# Patient Record
Sex: Male | Born: 1960 | Race: White | Hispanic: No | Marital: Married | Smoking: Never smoker
Health system: Southern US, Community
[De-identification: ages and names within clinical notes are randomized; demographics above are authoritative.]

## PROBLEM LIST (undated history)

## (undated) DIAGNOSIS — K649 Unspecified hemorrhoids: Secondary | ICD-10-CM

## (undated) DIAGNOSIS — T8859XA Other complications of anesthesia, initial encounter: Secondary | ICD-10-CM

## (undated) DIAGNOSIS — I10 Essential (primary) hypertension: Secondary | ICD-10-CM

## (undated) DIAGNOSIS — E781 Pure hyperglyceridemia: Secondary | ICD-10-CM

## (undated) DIAGNOSIS — K219 Gastro-esophageal reflux disease without esophagitis: Secondary | ICD-10-CM

## (undated) DIAGNOSIS — F419 Anxiety disorder, unspecified: Secondary | ICD-10-CM

## (undated) DIAGNOSIS — Z87442 Personal history of urinary calculi: Secondary | ICD-10-CM

## (undated) DIAGNOSIS — K449 Diaphragmatic hernia without obstruction or gangrene: Secondary | ICD-10-CM

## (undated) DIAGNOSIS — T4145XA Adverse effect of unspecified anesthetic, initial encounter: Secondary | ICD-10-CM

## (undated) HISTORY — DX: Pure hyperglyceridemia: E78.1

## (undated) HISTORY — DX: Anxiety disorder, unspecified: F41.9

---

## 2001-11-29 ENCOUNTER — Encounter: Payer: Self-pay | Admitting: Family Medicine

## 2001-11-29 ENCOUNTER — Ambulatory Visit (HOSPITAL_COMMUNITY): Admission: RE | Admit: 2001-11-29 | Discharge: 2001-11-29 | Payer: Self-pay | Admitting: Family Medicine

## 2002-03-21 ENCOUNTER — Ambulatory Visit (HOSPITAL_COMMUNITY): Admission: RE | Admit: 2002-03-21 | Discharge: 2002-03-21 | Payer: Self-pay | Admitting: Gastroenterology

## 2002-03-21 ENCOUNTER — Encounter: Payer: Self-pay | Admitting: Gastroenterology

## 2010-07-02 ENCOUNTER — Encounter: Admission: RE | Admit: 2010-07-02 | Discharge: 2010-07-02 | Payer: Self-pay | Admitting: Family Medicine

## 2010-10-17 HISTORY — PX: POSTEROLATERAL EXTRAFORAMINAL DISCECTOMY: SHX2260

## 2011-01-05 ENCOUNTER — Encounter (HOSPITAL_COMMUNITY)
Admission: RE | Admit: 2011-01-05 | Discharge: 2011-01-05 | Disposition: A | Discharge status: Home / Self Care | Payer: BC Managed Care – PPO | Source: Ambulatory Visit | Attending: Neurosurgery | Admitting: Neurosurgery

## 2011-01-05 DIAGNOSIS — Z01812 Encounter for preprocedural laboratory examination: Secondary | ICD-10-CM | POA: Insufficient documentation

## 2011-01-05 LAB — CBC
HCT: 40.9 % (ref 39.0–52.0)
Hemoglobin: 14.4 g/dL (ref 13.0–17.0)
MCH: 30.4 pg (ref 26.0–34.0)
MCHC: 35.2 g/dL (ref 30.0–36.0)
MCV: 86.3 fL (ref 78.0–100.0)
Platelets: 160 10*3/uL (ref 150–400)
RBC: 4.74 MIL/uL (ref 4.22–5.81)
RDW: 12.2 % (ref 11.5–15.5)
WBC: 5 10*3/uL (ref 4.0–10.5)

## 2011-01-05 LAB — SURGICAL PCR SCREEN
MRSA, PCR: NEGATIVE
Staphylococcus aureus: NEGATIVE

## 2011-01-13 ENCOUNTER — Observation Stay (HOSPITAL_COMMUNITY): Payer: BC Managed Care – PPO

## 2011-01-13 ENCOUNTER — Inpatient Hospital Stay (HOSPITAL_COMMUNITY)
Admission: RE | Admit: 2011-01-13 | Discharge: 2011-01-16 | DRG: 558 | Disposition: A | Discharge status: Home / Self Care | Payer: BC Managed Care – PPO | Source: Ambulatory Visit | Attending: Neurosurgery | Admitting: Neurosurgery

## 2011-01-13 DIAGNOSIS — J385 Laryngeal spasm: Secondary | ICD-10-CM | POA: Diagnosis not present

## 2011-01-13 DIAGNOSIS — R0989 Other specified symptoms and signs involving the circulatory and respiratory systems: Secondary | ICD-10-CM | POA: Diagnosis not present

## 2011-01-13 DIAGNOSIS — M47812 Spondylosis without myelopathy or radiculopathy, cervical region: Secondary | ICD-10-CM | POA: Diagnosis present

## 2011-01-13 DIAGNOSIS — Z882 Allergy status to sulfonamides status: Secondary | ICD-10-CM

## 2011-01-13 DIAGNOSIS — K5900 Constipation, unspecified: Secondary | ICD-10-CM | POA: Diagnosis not present

## 2011-01-13 DIAGNOSIS — J811 Chronic pulmonary edema: Secondary | ICD-10-CM | POA: Diagnosis not present

## 2011-01-13 DIAGNOSIS — Z23 Encounter for immunization: Secondary | ICD-10-CM

## 2011-01-13 DIAGNOSIS — M5020 Other cervical disc displacement, unspecified cervical region: Principal | ICD-10-CM | POA: Diagnosis present

## 2011-01-13 DIAGNOSIS — K219 Gastro-esophageal reflux disease without esophagitis: Secondary | ICD-10-CM | POA: Diagnosis present

## 2011-01-13 DIAGNOSIS — R0609 Other forms of dyspnea: Secondary | ICD-10-CM | POA: Diagnosis not present

## 2011-01-13 DIAGNOSIS — J690 Pneumonitis due to inhalation of food and vomit: Secondary | ICD-10-CM | POA: Diagnosis not present

## 2011-01-13 LAB — CBC
HCT: 40.7 % (ref 39.0–52.0)
Hemoglobin: 14.5 g/dL (ref 13.0–17.0)
MCH: 30.7 pg (ref 26.0–34.0)
MCHC: 35.6 g/dL (ref 30.0–36.0)
MCV: 86.2 fL (ref 78.0–100.0)
Platelets: 168 10*3/uL (ref 150–400)
RBC: 4.72 MIL/uL (ref 4.22–5.81)
RDW: 12.2 % (ref 11.5–15.5)
WBC: 6.7 10*3/uL (ref 4.0–10.5)

## 2011-01-13 LAB — BLOOD GAS, ARTERIAL
Acid-base deficit: 0.9 mmol/L (ref 0.0–2.0)
Bicarbonate: 24.7 mEq/L — ABNORMAL HIGH (ref 20.0–24.0)
Drawn by: 287601
FIO2: 1 %
MECHVT: 700 mL
O2 Saturation: 97.3 %
PEEP: 5 cmH2O
Patient temperature: 98.6
RATE: 14 resp/min
TCO2: 26.3 mmol/L (ref 0–100)
pCO2 arterial: 51.9 mmHg — ABNORMAL HIGH (ref 35.0–45.0)
pH, Arterial: 7.298 — ABNORMAL LOW (ref 7.350–7.450)
pO2, Arterial: 105 mmHg — ABNORMAL HIGH (ref 80.0–100.0)

## 2011-01-14 ENCOUNTER — Inpatient Hospital Stay (HOSPITAL_COMMUNITY): Payer: BC Managed Care – PPO

## 2011-01-14 LAB — BASIC METABOLIC PANEL
BUN: 12 mg/dL (ref 6–23)
CO2: 28 mEq/L (ref 19–32)
Calcium: 8.9 mg/dL (ref 8.4–10.5)
Chloride: 100 mEq/L (ref 96–112)
Creatinine, Ser: 1.03 mg/dL (ref 0.4–1.5)
GFR calc Af Amer: >60 mL/min (ref >60)
GFR calc non Af Amer: >60 mL/min (ref >60)
Glucose, Bld: 150 mg/dL — ABNORMAL HIGH (ref 70–99)
Potassium: 4.6 mEq/L (ref 3.5–5.1)
Sodium: 136 mEq/L (ref 135–145)

## 2011-01-14 LAB — DIFFERENTIAL
Basophils Absolute: 0 10*3/uL (ref 0.0–0.1)
Basophils Relative: 0 % (ref 0–1)
Eosinophils Absolute: 0 10*3/uL (ref 0.0–0.7)
Eosinophils Relative: 0 % (ref 0–5)
Lymphocytes Relative: 5 % — ABNORMAL LOW (ref 12–46)
Lymphs Abs: 1.1 10*3/uL (ref 0.7–4.0)
Monocytes Absolute: 1.4 10*3/uL — ABNORMAL HIGH (ref 0.1–1.0)
Monocytes Relative: 7 % (ref 3–12)
Neutro Abs: 19.4 10*3/uL — ABNORMAL HIGH (ref 1.7–7.7)
Neutrophils Relative %: 89 % — ABNORMAL HIGH (ref 43–77)

## 2011-01-14 LAB — CBC
HCT: 42.6 % (ref 39.0–52.0)
Hemoglobin: 14.9 g/dL (ref 13.0–17.0)
MCH: 30.5 pg (ref 26.0–34.0)
MCHC: 35 g/dL (ref 30.0–36.0)
MCV: 87.3 fL (ref 78.0–100.0)
Platelets: 185 10*3/uL (ref 150–400)
RBC: 4.88 MIL/uL (ref 4.22–5.81)
RDW: 12.5 % (ref 11.5–15.5)
WBC: 21.9 10*3/uL — ABNORMAL HIGH (ref 4.0–10.5)

## 2011-01-14 NOTE — Op Note (Signed)
Carlos Richards, [name]              ACCOUNT NO.:  [id number]  MEDICAL RECORD NO.:  [id number]           PATIENT TYPE:  I  LOCATION:  3110                         FACILITY:  [facility]  PHYSICIAN:  Hewitt Shorts, M.D.DATE OF BIRTH:  Dec 03, 1960  DATE OF PROCEDURE:  01/13/2011 DATE OF DISCHARGE:                              OPERATIVE REPORT   PREOPERATIVE DIAGNOSES:  Cervical disk herniation, cervical spondylosis, cervical degenerative disk disease, and cervical radiculopathy.  POSTOPERATIVE DIAGNOSES:  Cervical disk herniation, cervical spondylosis, cervical degenerative disk disease, and cervical radiculopathy.  PROCEDURE:  C5-6 anterior cervical decompression, arthrodesis with allograft and Tether cervical plating.  SURGEON:  Hewitt Shorts, MD  ANESTHESIA:  General endotracheal.  INDICATIONS:  The patient is a 50 year old man who presented with left cervical radiculopathy.  Symptoms have varied over the past 6 or more months but have persistently been on the left side, at times radiating more down to the left upper extremity and at times, more around the left scapula.  A decision was made to proceed with single-level ACDF.  PROCEDURE:  The patient was brought to the operating room and placed under general endotracheal anesthesia.  The patient was placed in 10 pounds of Holter traction.  The neck was prepped with Betadine soap solution and draped in sterile fashion.  A horizontal incision was made in the left side of neck.  The line of the incision was infiltrated with local anesthetic with epinephrine.  Dissection of the subcutaneous tissue and platysma.  Bipolar cautery was used to maintain hemostasis. Dissection was then carried out through the an avascular plane leaving the sternocleidomastoid, carotid artery, and jugular vein laterally and trachea and esophagus medially.  The ventral aspect of the vertebral column was identified and localized x-ray was taken and the  C5-6 intervertebral disk space was identified.  Diskectomy was begun with incision of the annulus and continued with micro curettes and pituitary rongeurs.  Anterior osteophytic overgrowth was removed using the osteophyte removal tool, Kerrison punches, and the high-speed drill. The operating microscope was draped and brought into the field to provide additional magnification, illumination, and visualization.  The remainder of the decompression was performed using microdissection and microsurgical technique.  The cartilaginous endplates of the vertebral bodies were removed using micro curettes along with a high-speed drill and then posterior osteophytic overgrowth was removed using the high- speed drill along a 2-mm Kerrison punch.  The posterior ligament that was carefully removed using a 2-mm Kerrison punch and we were able to decompress the central portion of the spinal canal.  The decompression extending to the right was completed, decompressing the lateral recess and neural foramen and then we turned our attention to the left lateral recess and neural foramen where there was spondylotic disk herniation. This was carefully removed in a piecemeal fashion with 2-mm Kerrison punch extending laterally into the neural foramen, decompressing the exiting left C6 nerve root.  Once decompression was completed, hemostasis was established with use of Gelfoam soaked in thrombin.  We measured the height of the intervertebral disk space and selected a 7-mm allograft implant.  It was positioned in the intervertebral disk  space and countersunk.  We then selected a 12-mm Tether cervical plate.  It was positioned over the fusion construct and secured to the vertebra with 4 x 14 mm screws using a pair of 6 screws at C5 and a pair of variable screws at C6.  X-ray was taken which showed the graft, plate, and screws all in good position.  The alignment was good and then the wound was irrigated with  bacitracin solution, checked for hemostasis which was established and confirmed and then we proceeded with closure. The platysma was closed with interrupted inverted 2-0 undyed Vicryl sutures, subcutaneous and subcuticular were closed with interrupted inverted 3-0 undyed Vicryl sutures, and skin was reapproximated with Dermabond.  The procedure was tolerated well.  The estimated blood loss was 75 mL.  Sponge and needles counts were correct.  Following surgery, the patient was reversed from the anesthetic, extubated, and transferred to recovery room for further care.  However, shortly after arriving in the recovery room, he developed laryngospasm and oxygen desaturation. He was attended to immediately by the Anesthesia Service bagged with 100% oxygen, re-anesthetized and reintubated.  The patient was placed on ventilator.  The sedation of the anesthesia was allowed to diminish such that the patient could open his eyes and he was noted to be moving all 4 extremities to command, squeezing my hand strongly, wiggling his toes well, protruding his tongue on command, holding thumbs up on command. He was then re-sedated and briefly managed by the Anesthesia Service and extubated upon their assessment.  His incision in the recovery room was noted to be well closed.  No underlying swelling.  No evidence of hematoma.     Hewitt Shorts, M.D.     RWN/MEDQ  D:  01/13/2011  T:  01/14/2011  Job:  [id number]  Electronically Signed by Shirlean Kelly M.D. on 01/14/2011 09:02:45 AM

## 2011-01-15 ENCOUNTER — Inpatient Hospital Stay (HOSPITAL_COMMUNITY): Payer: BC Managed Care – PPO

## 2011-01-15 LAB — CBC
HCT: 40.3 % (ref 39.0–52.0)
Hemoglobin: 13.8 g/dL (ref 13.0–17.0)
MCH: 31.4 pg (ref 26.0–34.0)
MCHC: 34.2 g/dL (ref 30.0–36.0)
MCV: 91.6 fL (ref 78.0–100.0)
Platelets: 152 10*3/uL (ref 150–400)
RBC: 4.4 MIL/uL (ref 4.22–5.81)
RDW: 12.8 % (ref 11.5–15.5)
WBC: 12.5 10*3/uL — ABNORMAL HIGH (ref 4.0–10.5)

## 2011-01-15 LAB — BASIC METABOLIC PANEL
BUN: 15 mg/dL (ref 6–23)
CO2: 33 mEq/L — ABNORMAL HIGH (ref 19–32)
Calcium: 8.8 mg/dL (ref 8.4–10.5)
Chloride: 105 mEq/L (ref 96–112)
Creatinine, Ser: 1.1 mg/dL (ref 0.4–1.5)
GFR calc Af Amer: >60 mL/min (ref >60)
GFR calc non Af Amer: >60 mL/min (ref >60)
Glucose, Bld: 104 mg/dL — ABNORMAL HIGH (ref 70–99)
Potassium: 5 mEq/L (ref 3.5–5.1)
Sodium: 140 mEq/L (ref 135–145)

## 2011-01-16 ENCOUNTER — Inpatient Hospital Stay (HOSPITAL_COMMUNITY): Payer: BC Managed Care – PPO

## 2011-01-16 DIAGNOSIS — J954 Chemical pneumonitis due to anesthesia: Secondary | ICD-10-CM

## 2011-01-20 NOTE — Discharge Summary (Signed)
Carlos Richards, Carlos Richards              ACCOUNT NO.:  [id number]  MEDICAL RECORD NO.:  [id number]           PATIENT TYPE:  I  LOCATION:  3017                         FACILITY:  [facility]  PHYSICIAN:  Hewitt Shorts, M.D.DATE OF BIRTH:  Jan 13, 1961  DATE OF ADMISSION:  01/13/2011 DATE OF DISCHARGE:  01/16/2011                              DISCHARGE SUMMARY   ADMISSION HISTORY AND PHYSICAL EXAMINATION:  The patient is a 50 year old man who presented with left cervical radiculopathy over 6 months ago.  He had been treated with nonsurgical measures without relief and had persistent radicular symptoms.  General examination was unremarkable.  Neurologic examination showed intact strength and sensation.  HOSPITAL COURSE:  The patient was admitted, underwent a single level C5- 6 anterior cervical decompression, arthrodesis with allograft and Tether cervical plating.  He did well for the surgery; however, following emergence from the anesthesia and upon transfer to the recovery room, he suddenly developed hypoxia and laryngospasm.  He was immediately attended to by the nurse anesthetist and anesthesiologist.  He was ventilated with a mask and oxygen and was reanesthetized and reintubated by the Anesthesia Service.  Dr. Randa Evens, the anesthesiologist, monitored him over the next 3-4 hours, allowed him to emerge from the sedation and then he was re-extubated and did well.  However, he had initially a persistent cough.  Chest x-ray showed bilateral airspace disease.  The chest x-ray was repeated the following day while he was being monitored in the intensive care unit.  It showed patchy airspace disease bilaterally.  He had a white blood cell count of 21.9 (but he had been given Decadron by Dr. Randa Evens in the recovery room).  I therefore obtained a Pulmonary Medicine consultation from Dr. Billy Fischer.  He felt that the appearance was most likely negative pressure pulmonary edema, but he could  not rule out aspiration.  Chest x-rays were repeated over the past 2 days and showed progressive clearing of the airspace disease.  He however was started by Dr. Sung Amabile on Augmentin and continues on this.  Dr. Shelle Iron has seen him in followup yesterday and today.  He feels that he can be discharged to home but continued on Augmentin 875 mg b.i.d. for 8 days and a prescription has been written for that.  From the cervical spine perspective, he has been up and ambulating, his wound is healing nicely.  There is no swelling, erythema, or drainage. He is in his cervical collar.  He is walking in the halls.  However, he has had difficulties with constipation, and I discussed with him giving him a laxatives and monitoring him until tomorrow versus giving him a laxative and having him continued treatment with other laxatives if needed.  He would prefer the latter approach.  We are going to give him a dose of milk of magnesia but he understands that he can use Dulcolax tablets, Dulcolax suppositories, Fleet Phospho-Soda, or magnesium citrate as needed for further constipation.  We are giving him a prescription for Vicodin one or two tablets p.o. q.4- 6 h. p.r.n. pain.  He is to follow up with me in 3 weeks for  followup. If he needs to return sooner or has other difficulties, he is to contact us.  DISCHARGE DIAGNOSES: 1. Cervical disk herniations, cervical spondylosis, cervical     degenerative disease, cervical radiculopathy. 2. Bilateral airspace disease. 3. Negative-pressure pulmonary edema secondary to laryngospasm and     straining versus aspiration pneumonia, improving on antibiotics.     Also white blood cell count improved in followup.  Discharge prescription for Vicodin 1-2 tablets p.o. q.4-6 h. p.r.n. pain, 40 tablets, no refills; Augmentin 875 mg b.i.d. 16 tablets, no refills.     Hewitt Shorts, M.D.     RWN/MEDQ  D:  01/16/2011  T:  01/16/2011  Job:   [id number]  Electronically Signed by Shirlean Kelly M.D. on 01/20/2011 09:20:41 AM

## 2011-01-20 NOTE — H&P (Signed)
Carlos Richards, Carlos Richards              ACCOUNT NO.:  [id number]  MEDICAL RECORD NO.:  [id number]           PATIENT TYPE:  I  LOCATION:  3017                         FACILITY:  [facility]  PHYSICIAN:  Hewitt Shorts, M.D.DATE OF BIRTH:  [birth year]  DATE OF ADMISSION:  01/13/2011 DATE OF DISCHARGE:                             HISTORY & PHYSICAL   HISTORY OF PRESENT ILLNESS:  The patient is a 50 year old left-handed white male whom we have followed for over 6 months because of left cervical radiculopathy with neck pain and varying pain at times down to the left extremity, at times more around the left scapula.  He has been treated with prednisone Dosepak, several NSAIDS, but has had persistent radicular pain, numbness, and tingling.  The patient was evaluated with x-rays and MRI scan.  He has multilevel degenerative disk disease and spondylosis, but with a focal left C5-6 cervical disk herniation with thecal sac and nerve root compression and the patient is admitted now for single level C5-6 anterior cervical decompression and arthrodesis.  PAST MEDICAL HISTORY:  Denies any history of hypertension, myocardial function, cancer, stroke, diabetes, peptic ulcer disease, or lung disease.  His only previous surgery was ophthalmologic surgery.  He reports an allergy to SULFA.  MEDICATIONS PRIOR TO ADMISSION:  Tylenol, Naprosyn, and multivitamin as needed.  FAMILY HISTORY:  Mother is late 58s with atherosclerotic cardiovascular disease, status post coronary stenting, anemia, lung cancer, and osteoporosis.  Father's age early 30s, in good health with a history of prostate cancer.  SOCIAL HISTORY:  The patient is married.  He is a Marine scientist.  He does not smoke, drink alcoholic beverages, or have a history of substance abuse.  REVIEW OF SYSTEMS:  Notable for as described in the history of present illness and past medical history, is otherwise unremarkable.  PHYSICAL EXAMINATION:   GENERAL:  The patient a well-developed, well- nourished white male in no acute distress. VITAL SIGNS:  Temperature 97.6, pulse 69, blood pressure 143/75, respiratory rate 20, height 5 feet 8 inches, and weight 182 pounds. LUNGS:  Clear to auscultation.  He has symmetric respiratory excursion. HEART:  Regular rate and rhythm.  Normal S1 and S2.  There is no murmur. EXTREMITIES:  No clubbing, cyanosis, or edema. MUSCULOSKELETAL:  He can flex his neck fairly well, but has diminished extension and he is be able to laterally flex his neck well. NEUROLOGIC:  5/5 strength in the deltoid, biceps, triceps, intrinsic, and grip bilaterally.  Sensation is intact to pinprick through the digits of the upper extremities.  Reflexes at left biceps and brachialis are absent, right biceps and brachialis are 1, triceps are absent bilaterally.  Quadriceps and gastrocnemius are 2 bilaterally.  Toes are downgoing bilaterally.  He has normal gait and stance.  IMPRESSION:  Left cervical radiculopathy secondary to left C5-6 cervical disk herniation.  PLAN:  The patient will be admitted for single level C5-6 anterior cervical diskectomy and arthrodesis with bone graft and cervical plating.  He has been treated extensively nonsurgically.  We have discussed alternatives of surgery, the nature of surgical procedure, typical length of surgery, hospital stay, overall occupation  limitations postoperatively, and risks include risks of infection, bleeding, possible need of transfusion, risks of nerve dysfunction, pain, weaknesses, paresthesia, risks of failure of arthrodesis, possible need for further surgery, anesthetic risk, myocardial function, stroke, pneumonia, and death.  Understanding all of this, he wished to proceed with surgery and is admitted for such.     Hewitt Shorts, M.D.     RWN/MEDQ  D:  01/15/2011  T:  01/16/2011  Job:  [id number]  Electronically Signed by Shirlean Kelly M.D. on 01/20/2011  09:20:39 AM

## 2014-01-02 ENCOUNTER — Encounter (HOSPITAL_COMMUNITY): Payer: Self-pay | Admitting: Emergency Medicine

## 2014-01-02 ENCOUNTER — Emergency Department (HOSPITAL_COMMUNITY)
Admission: EM | Admit: 2014-01-02 | Discharge: 2014-01-02 | Disposition: A | Discharge status: Home / Self Care | Payer: BC Managed Care – PPO | Attending: Emergency Medicine | Admitting: Emergency Medicine

## 2014-01-02 DIAGNOSIS — R5381 Other malaise: Secondary | ICD-10-CM | POA: Insufficient documentation

## 2014-01-02 DIAGNOSIS — R42 Dizziness and giddiness: Secondary | ICD-10-CM | POA: Insufficient documentation

## 2014-01-02 DIAGNOSIS — R5383 Other fatigue: Secondary | ICD-10-CM

## 2014-01-02 DIAGNOSIS — R51 Headache: Secondary | ICD-10-CM | POA: Insufficient documentation

## 2014-01-02 DIAGNOSIS — F419 Anxiety disorder, unspecified: Secondary | ICD-10-CM

## 2014-01-02 DIAGNOSIS — F411 Generalized anxiety disorder: Secondary | ICD-10-CM | POA: Insufficient documentation

## 2014-01-02 DIAGNOSIS — Z8719 Personal history of other diseases of the digestive system: Secondary | ICD-10-CM | POA: Insufficient documentation

## 2014-01-02 DIAGNOSIS — I10 Essential (primary) hypertension: Secondary | ICD-10-CM | POA: Insufficient documentation

## 2014-01-02 HISTORY — DX: Essential (primary) hypertension: I10

## 2014-01-02 HISTORY — DX: Unspecified hemorrhoids: K64.9

## 2014-01-02 HISTORY — DX: Diaphragmatic hernia without obstruction or gangrene: K44.9

## 2014-01-02 HISTORY — DX: Gastro-esophageal reflux disease without esophagitis: K21.9

## 2014-01-02 LAB — CBC WITH DIFFERENTIAL/PLATELET
Basophils Absolute: 0 10*3/uL (ref 0.0–0.1)
Basophils Relative: 0 % (ref 0–1)
Eosinophils Absolute: 0.1 10*3/uL (ref 0.0–0.7)
Eosinophils Relative: 2 % (ref 0–5)
HCT: 43.7 % (ref 39.0–52.0)
Hemoglobin: 15.8 g/dL (ref 13.0–17.0)
Lymphocytes Relative: 11 % — ABNORMAL LOW (ref 12–46)
Lymphs Abs: 1 10*3/uL (ref 0.7–4.0)
MCH: 31.9 pg (ref 26.0–34.0)
MCHC: 36.2 g/dL — ABNORMAL HIGH (ref 30.0–36.0)
MCV: 88.1 fL (ref 78.0–100.0)
Monocytes Absolute: 0.8 10*3/uL (ref 0.1–1.0)
Monocytes Relative: 9 % (ref 3–12)
Neutro Abs: 6.9 10*3/uL (ref 1.7–7.7)
Neutrophils Relative %: 78 % — ABNORMAL HIGH (ref 43–77)
Platelets: 124 10*3/uL — ABNORMAL LOW (ref 150–400)
RBC: 4.96 MIL/uL (ref 4.22–5.81)
RDW: 12.4 % (ref 11.5–15.5)
WBC: 8.8 10*3/uL (ref 4.0–10.5)

## 2014-01-02 LAB — I-STAT TROPONIN, ED: Troponin i, poc: 0 ng/mL (ref 0.00–0.08)

## 2014-01-02 LAB — BASIC METABOLIC PANEL
BUN: 13 mg/dL (ref 6–23)
CO2: 26 mEq/L (ref 19–32)
Calcium: 9.4 mg/dL (ref 8.4–10.5)
Chloride: 100 mEq/L (ref 96–112)
Creatinine, Ser: 0.93 mg/dL (ref 0.50–1.35)
GFR calc Af Amer: >90 mL/min (ref >90)
GFR calc non Af Amer: >90 mL/min (ref >90)
Glucose, Bld: 90 mg/dL (ref 70–99)
Potassium: 4.8 mEq/L (ref 3.7–5.3)
Sodium: 139 mEq/L (ref 137–147)

## 2014-01-02 MED ORDER — LORAZEPAM 0.5 MG PO TABS: 0.5000 mg | ORAL_TABLET | Freq: Three times a day (TID) | ORAL | Status: DC

## 2014-01-02 MED ORDER — LORAZEPAM 1 MG PO TABS
0.5000 mg | ORAL_TABLET | Freq: Once | ORAL | Status: AC
  Administered 2014-01-02: 0.5 mg via ORAL
  Filled 2014-01-02: qty 1

## 2014-01-02 NOTE — ED Notes (Signed)
Pt presents from home via [facility] with c/o hypertension, generalized weakness, lightheadedness, nausea, and headache. Pt reports his mother passed away a few days ago and has been under a large amount of stress lately.

## 2014-01-02 NOTE — ED Provider Notes (Signed)
CSN: [id number]     Arrival date & time 01/02/14  1257 History   First MD Initiated Contact with Patient 01/02/14 1317     Chief Complaint  Patient presents with  . Hypertension     (Consider location/radiation/quality/duration/timing/severity/associated sxs/prior Treatment) Patient is a 53 y.o. male presenting with hypertension. The history is provided by the patient and the spouse.  Hypertension Associated symptoms include fatigue and headaches. Pertinent negatives include no abdominal pain, chest pain, chills, coughing, diaphoresis, fever, nausea, sore throat or vomiting.  This is a 52yo WM with PMH HTN (not on medications), GERD who presents w/ c/o generalized weakness, lighteadedness and HA. Patient notes that he has been more stressed than usual since his mother died a few days ago. He has been more fatigued than usual recently and he has not been sleeping well 2/2 anxiety. Around 9AM this morning he got out of the shower and noticed his face was red and that he had a dull/aching HA to L posterior head. At this point he thought his blood pressure was elevated as he has experienced these symptoms in the past when his blood pressure was elevated. The HA lasted a few hours this morning, no vision changes, aura symptoms, focal neurologic s/s. He does not have a HA now. He states that he was at work this morning he began feeling lightheaded around 11:30, no SOB, diaphoresis, chest pain, syncope or LOC. He has had some upper abdominal "burning" recently, but this is identical to his acid reflux symptoms. No BLE swelling, DOE, PND recently.   Past Medical History  Diagnosis Date  . Hiatal hernia   . GERD (gastroesophageal reflux disease)   . Hypertension   . Hemorrhoid    Past Surgical History  Procedure Laterality Date  . Posterolateral extraforaminal discectomy  2012    C5, C6   History reviewed. No pertinent family history. History  Substance Use Topics  . Smoking status: Never Smoker    . Smokeless tobacco: Not on file  . Alcohol Use: No    Review of Systems  Constitutional: Positive for fatigue. Negative for fever, chills and diaphoresis.  HENT: Negative for sore throat.   Eyes: Negative for visual disturbance.  Respiratory: Negative for cough and shortness of breath.   Cardiovascular: Negative for chest pain, palpitations and leg swelling.  Gastrointestinal: Negative for nausea, vomiting, abdominal pain and diarrhea.  Genitourinary: Negative for dysuria.  Musculoskeletal: Negative for back pain.  Neurological: Positive for light-headedness and headaches. Negative for facial asymmetry and speech difficulty.  All other systems reviewed and are negative.    Allergies  Pollen extract and Sulfa antibiotics  Home Medications  No current outpatient prescriptions on file. BP 147/84  Pulse 82  Temp(Src) 98.6 F (37 C) (Oral)  Resp 18  Ht 5\' 8"  (1.727 m)  Wt 195 lb (88.451 kg)  BMI 29.66 kg/m2  SpO2 100% Physical Exam  Nursing note and vitals reviewed. Constitutional: He is oriented to person, place, and time. He appears well-developed and well-nourished. No distress.  Anxious appearing  HENT:  Head: Normocephalic and atraumatic.  Mouth/Throat: Oropharynx is clear and moist.  Eyes: Conjunctivae and EOM are normal. Pupils are equal, round, and reactive to light.  Neck: Neck supple.  Cardiovascular: Normal rate, regular rhythm and normal heart sounds.   Pulmonary/Chest: Effort normal and breath sounds normal. No respiratory distress.  Abdominal: Soft. Bowel sounds are normal. There is no tenderness.  Musculoskeletal: He exhibits no edema.  Neurological: He is alert  and oriented to person, place, and time. No cranial nerve deficit. Coordination normal.  Skin: Skin is warm and dry. He is not diaphoretic.    ED Course  Procedures (including critical care time) Labs Review Labs Reviewed  CBC WITH DIFFERENTIAL  BASIC METABOLIC PANEL  I-STAT TROPOININ, ED    Imaging Review No results found.   EKG Interpretation None      MDM   This is a 52yo WM w/ PMH HTN, GERD who presents with generalized fatigue, HA, transient lightheadedness in the setting of increased stress at home recently. Patient is very anxious appearing on exam. He does have worsening of his acid reflux recently, but this does not sound new or different and patient states it is identical to his GERD symptoms, therefore ACS very unlikely. However, given his age, RF of HTN and episode of lighteadedness today, I will check troponin and EKG. Will also obtain basic lab work. I spoke with patient and his wife and they both agree pt is much more anxious than usual. Will give ativan .5mg  now.   2:53 PM Patient feeling much better after receiving ativan. Blood pressure is 140/89 and the rest of vital signs are stable. Patient is ready for discharge. I will write prescription for ativan, #15 to last him a few days during this stressful time period. He was instructed to follow up with his PCP for further evaluation of possible hypertension. He agrees. He was asked to return to the ED with any worsening of his symptoms or development of episodes of chest pain, SOB, diaphoresis, N/V.  Windell Hummingbirdachel Jamiracle Avants, MD 01/02/14 [phone]

## 2014-01-02 NOTE — ED Notes (Signed)
Pt comfortable with discharge and follow up instructions. Prescriptions x1 

## 2014-01-02 NOTE — Discharge Instructions (Signed)

## 2014-01-03 NOTE — ED Provider Notes (Signed)
I saw and evaluated the patient, reviewed the resident's note and I agree with the findings and plan.   EKG Interpretation   Date/Time:  Thursday January 02 2014 13:06:56 EDT Ventricular Rate:  78 PR Interval:  148 QRS Duration: 94 QT Interval:  366 QTC Calculation: 417 R Axis:   -14 Text Interpretation:  Sinus rhythm RSR' in V1 or V2, probably normal  variant Baseline wander in lead(s) V3 No significant change since last  tracing Confirmed by Rubin PayorPICKERING  MD, Harrold DonathNATHAN [phone]([id number]) on 01/02/2014 1:58:14 PM     Patient with chest pain. EKG reassuring. Also had some dizziness. Laboratory reassuring. EKG reassuring. Will discharge home  Juliet Rudeathan R. Rubin PayorPickering, MD 01/03/14 0700

## 2015-01-12 ENCOUNTER — Encounter: Payer: Self-pay | Admitting: Diagnostic Neuroimaging

## 2015-01-12 ENCOUNTER — Ambulatory Visit (INDEPENDENT_AMBULATORY_CARE_PROVIDER_SITE_OTHER): Payer: BLUE CROSS/BLUE SHIELD | Admitting: Diagnostic Neuroimaging

## 2015-01-12 VITALS — BP 133/90 | HR 73 | Ht 65.0 in | Wt 204.4 lb

## 2015-01-12 DIAGNOSIS — R202 Paresthesia of skin: Secondary | ICD-10-CM | POA: Diagnosis not present

## 2015-01-12 DIAGNOSIS — R42 Dizziness and giddiness: Secondary | ICD-10-CM

## 2015-01-12 DIAGNOSIS — R200 Anesthesia of skin: Secondary | ICD-10-CM

## 2015-01-12 DIAGNOSIS — F430 Acute stress reaction: Secondary | ICD-10-CM

## 2015-01-12 NOTE — Patient Instructions (Signed)
I will check MRI brain.  Follow with Dr. Azucena CecilSwayne to consider psychiatry/psychology referral for stress/anxiety.

## 2015-01-12 NOTE — Progress Notes (Signed)
[facility]  PATIENT: Carlos Richards DOB: [birth year]  REFERRING CLINICIAN: Swayne HISTORY FROM: patient  REASON FOR VISIT: new consult    HISTORICAL  CHIEF COMPLAINT:  Chief Complaint  Patient presents with  . New Evaluation    disequilibrium    HISTORY OF PRESENT ILLNESS:   54 year old left-handed male here for evaluation of brain snap sensation, dizziness, generalized tingling and anxiety. Patient has hypertension, anxiety, ADD, hypertriglyceridemia, sleep apnea. Patient referred for consultation by Dr. Azucena Cecil.  For past 1 month patient has had intermittent episodes of brain surgery, snap sensation lasting for 2-3 seconds. Sometimes he feels dizziness with this. Sometimes he has numbness and tingling. Times he has a creepy crawly sensation on his neck into his face.  Significant stress factors have been present for the past 1-2 years. This includes his mother passing away 1 year ago, his father declining with dementia and moving to assisted living 2 months ago, his brother having prostate cancer 6 months ago, his daughter about to get married in May 2016. He works as a Marine scientist, and his work environment has been more challenging and difficult lately. Patient notes increasing anxiety problems recently. He is not on any medication for this. He's never been evaluated for this before.   REVIEW OF SYSTEMS: Full 14 system review of systems performed and notable only for snoring headache dizziness anxiety ringing in ears.  ALLERGIES: Allergies  Allergen Reactions  . Apple Other (See Comments)    Can eat apples but cannot eat the peeling. Causes throat to itch.  . Pollen Extract   . Sulfa Antibiotics Other (See Comments)    Stomach pain    HOME MEDICATIONS: Outpatient Prescriptions Prior to Visit  Medication Sig Dispense Refill  . docusate sodium (COLACE) 100 MG capsule Take 100 mg by mouth daily.    Marland Kitchen loratadine (CLARITIN) 10 MG tablet Take 10  mg by mouth daily.    . Multiple Vitamin (MULTIVITAMIN WITH MINERALS) TABS tablet Take 1 tablet by mouth daily.    . naproxen sodium (ANAPROX) 220 MG tablet Take 220 mg by mouth 2 (two) times daily as needed (for pain).    . LORazepam (ATIVAN) 0.5 MG tablet Take 1 tablet (0.5 mg total) by mouth every 8 (eight) hours. 15 tablet 0   No facility-administered medications prior to visit.    PAST MEDICAL HISTORY: Past Medical History  Diagnosis Date  . Hiatal hernia   . GERD (gastroesophageal reflux disease)   . Hypertension   . Hemorrhoid   . Hypertriglyceridemia   . Anxiety     PAST SURGICAL HISTORY: Past Surgical History  Procedure Laterality Date  . Posterolateral extraforaminal discectomy  2012    C5, C6    FAMILY HISTORY: Family History  Problem Relation Age of Onset  . Kidney failure Mother   . COPD Mother   . Prostate cancer Brother   . Prostate cancer Father   . Uterine cancer Mother   . Hypertension Brother   . Dementia Father   . Hypertension Father   . Hypertension Mother     SOCIAL HISTORY:  History   Social History  . Marital Status: Married    Spouse Name: Debarah Crape  . Number of Children: 2  . Years of Education: Bachelors    Occupational History  . Not on file.   Social History Main Topics  . Smoking status: Never Smoker   . Smokeless tobacco: Not on file  . Alcohol Use: No  .  Drug Use: No  . Sexual Activity: Not on file   Other Topics Concern  . Not on file   Social History Narrative   Drinks no caffeine   Is right and left handed   Lives with wife and children     PHYSICAL EXAM  Filed Vitals:   01/12/15 0919  BP: 133/90  Pulse: 73  Height: 5\' 5"  (1.651 m)  Weight: 204 lb 6.4 oz (92.715 kg)    Body mass index is 34.01 kg/(m^2).   Visual Acuity Screening   Right eye Left eye Both eyes  Without correction: 20/40 20/30   With correction:       No flowsheet data found.  GENERAL EXAM: Patient is in no distress; well  developed, nourished and groomed; neck is supple  CARDIOVASCULAR: Regular rate and rhythm, no murmurs, no carotid bruits  NEUROLOGIC: MENTAL STATUS: awake, alert, oriented to person, place and time, recent and remote memory intact, normal attention and concentration, language fluent, comprehension intact, naming intact, fund of knowledge appropriate CRANIAL NERVE: no papilledema on fundoscopic exam, pupils equal and reactive to light, visual fields full to confrontation, extraocular muscles intact, no nystagmus, facial sensation and strength symmetric, hearing intact, palate elevates symmetrically, uvula midline, shoulder shrug symmetric, tongue midline. MOTOR: normal bulk and tone, full strength in the BUE, BLE SENSORY: normal and symmetric to light touch, pinprick, temperature, vibration  COORDINATION: finger-nose-finger, fine finger movements normal REFLEXES: deep tendon reflexes present and symmetric GAIT/STATION: narrow based gait; able to walk tandem; romberg is negative    DIAGNOSTIC DATA (LABS, IMAGING, TESTING) - I reviewed patient records, labs, notes, testing and imaging myself where available.  Lab Results  Component Value Date   WBC 8.8 01/02/2014   HGB 15.8 01/02/2014   HCT 43.7 01/02/2014   MCV 88.1 01/02/2014   PLT 124* 01/02/2014      Component Value Date/Time   NA 139 01/02/2014 1345   K 4.8 01/02/2014 1345   CL 100 01/02/2014 1345   CO2 26 01/02/2014 1345   GLUCOSE 90 01/02/2014 1345   BUN 13 01/02/2014 1345   CREATININE 0.93 01/02/2014 1345   CALCIUM 9.4 01/02/2014 1345   GFRNONAA >90 01/02/2014 1345   GFRAA >90 01/02/2014 1345   No results found for: CHOL, HDL, LDLCALC, LDLDIRECT, TRIG, CHOLHDL No results found for: ZOXW9UHGBA1C No results found for: VITAMINB12 No results found for: TSH    07/03/10 MRI cervical spine [I reviewed images myself and agree with interpretation. -VRP]  1. Disc osteophyte complex on the left at C5-6 contributing to mass effect  on the left side of the thecal sac and on the left C6 nerve root. 2. Bulging annulus and minimal spurring at C6-7 without significant spinal or foraminal stenosis.    ASSESSMENT AND PLAN  54 y.o. year old male here with suspected stress/anxiety reaction, leading to brain zap/snap sensations and tingling. Will check MRI brain to rule out secondary causes (CNS inflamm, vascular, neoplastic) which are less likely.   PLAN: - MRI brain w/wo - psychiatry/psychology evaluation (per PCP)  Orders Placed This Encounter  Procedures  . MR Brain W Wo Contrast   Return in about 6 weeks (around 02/23/2015).    Suanne MarkerVIKRAM R. Rolan Wrightsman, MD 01/12/2015, 10:18 AM Certified in Neurology, Neurophysiology and Neuroimaging  [facility] [address], [address] [place], [place] [address] [phone]

## 2015-01-14 ENCOUNTER — Other Ambulatory Visit: Payer: BLUE CROSS/BLUE SHIELD

## 2015-01-21 ENCOUNTER — Ambulatory Visit: Payer: 59

## 2015-02-24 ENCOUNTER — Ambulatory Visit: Payer: BLUE CROSS/BLUE SHIELD | Admitting: Diagnostic Neuroimaging

## 2015-06-09 ENCOUNTER — Other Ambulatory Visit: Payer: Self-pay | Admitting: Family Medicine

## 2015-06-09 DIAGNOSIS — R1030 Lower abdominal pain, unspecified: Secondary | ICD-10-CM

## 2015-06-10 ENCOUNTER — Ambulatory Visit
Admission: RE | Admit: 2015-06-10 | Discharge: 2015-06-10 | Disposition: A | Discharge status: Home / Self Care | Payer: 59 | Source: Ambulatory Visit | Attending: Family Medicine | Admitting: Family Medicine

## 2015-06-10 DIAGNOSIS — R1030 Lower abdominal pain, unspecified: Secondary | ICD-10-CM

## 2015-06-10 MED ORDER — IOPAMIDOL (ISOVUE-300) INJECTION 61%
125.0000 mL | Freq: Once | INTRAVENOUS | Status: AC | PRN
  Administered 2015-06-10: 125 mL via INTRAVENOUS

## 2017-02-06 DIAGNOSIS — I10 Essential (primary) hypertension: Secondary | ICD-10-CM | POA: Diagnosis not present

## 2017-02-06 DIAGNOSIS — Z0000 Encounter for general adult medical examination without abnormal findings: Secondary | ICD-10-CM | POA: Diagnosis not present

## 2017-02-06 DIAGNOSIS — E782 Mixed hyperlipidemia: Secondary | ICD-10-CM | POA: Diagnosis not present

## 2017-02-17 DIAGNOSIS — R739 Hyperglycemia, unspecified: Secondary | ICD-10-CM | POA: Diagnosis not present

## 2017-03-09 DIAGNOSIS — N201 Calculus of ureter: Secondary | ICD-10-CM | POA: Diagnosis not present

## 2017-03-15 ENCOUNTER — Other Ambulatory Visit: Payer: Self-pay | Admitting: Urology

## 2017-03-16 ENCOUNTER — Encounter (HOSPITAL_COMMUNITY): Payer: Self-pay | Admitting: *Deleted

## 2017-03-20 ENCOUNTER — Ambulatory Visit (HOSPITAL_COMMUNITY): Admission: RE | Admit: 2017-03-20 | Payer: BLUE CROSS/BLUE SHIELD | Source: Ambulatory Visit | Admitting: Urology

## 2017-03-20 HISTORY — DX: Other complications of anesthesia, initial encounter: T88.59XA

## 2017-03-20 HISTORY — DX: Adverse effect of unspecified anesthetic, initial encounter: T41.45XA

## 2017-03-20 HISTORY — DX: Personal history of urinary calculi: Z87.442

## 2017-03-20 SURGERY — LITHOTRIPSY, ESWL
Anesthesia: LOCAL | Laterality: Right

## 2017-03-23 DIAGNOSIS — N201 Calculus of ureter: Secondary | ICD-10-CM | POA: Diagnosis not present

## 2017-03-28 ENCOUNTER — Other Ambulatory Visit: Payer: Self-pay | Admitting: Urology

## 2017-03-31 ENCOUNTER — Encounter (HOSPITAL_COMMUNITY): Payer: Self-pay | Admitting: General Practice

## 2017-04-03 ENCOUNTER — Encounter (HOSPITAL_COMMUNITY)
Admission: RE | Disposition: A | Discharge status: Home / Self Care | Payer: Self-pay | Source: Ambulatory Visit | Attending: Urology

## 2017-04-03 ENCOUNTER — Ambulatory Visit (HOSPITAL_COMMUNITY): Payer: BLUE CROSS/BLUE SHIELD

## 2017-04-03 ENCOUNTER — Ambulatory Visit (HOSPITAL_COMMUNITY)
Admission: RE | Admit: 2017-04-03 | Discharge: 2017-04-03 | Disposition: A | Discharge status: Home / Self Care | Payer: BLUE CROSS/BLUE SHIELD | Source: Ambulatory Visit | Attending: Urology | Admitting: Urology

## 2017-04-03 ENCOUNTER — Encounter (HOSPITAL_COMMUNITY): Payer: Self-pay | Admitting: *Deleted

## 2017-04-03 DIAGNOSIS — Z7982 Long term (current) use of aspirin: Secondary | ICD-10-CM | POA: Insufficient documentation

## 2017-04-03 DIAGNOSIS — N200 Calculus of kidney: Secondary | ICD-10-CM | POA: Diagnosis not present

## 2017-04-03 DIAGNOSIS — N201 Calculus of ureter: Secondary | ICD-10-CM | POA: Diagnosis not present

## 2017-04-03 DIAGNOSIS — G4730 Sleep apnea, unspecified: Secondary | ICD-10-CM | POA: Diagnosis not present

## 2017-04-03 DIAGNOSIS — I10 Essential (primary) hypertension: Secondary | ICD-10-CM | POA: Diagnosis not present

## 2017-04-03 HISTORY — PX: EXTRACORPOREAL SHOCK WAVE LITHOTRIPSY: SHX1557

## 2017-04-03 SURGERY — LITHOTRIPSY, ESWL
Anesthesia: LOCAL | Laterality: Right

## 2017-04-03 MED ORDER — SODIUM CHLORIDE 0.9 % IV SOLN
INTRAVENOUS | Status: DC
  Administered 2017-04-03: 11:00:00 via INTRAVENOUS

## 2017-04-03 MED ORDER — DIPHENHYDRAMINE HCL 25 MG PO CAPS
25.0000 mg | ORAL_CAPSULE | ORAL | Status: AC
  Administered 2017-04-03: 25 mg via ORAL
  Filled 2017-04-03: qty 1

## 2017-04-03 MED ORDER — DIAZEPAM 5 MG PO TABS
10.0000 mg | ORAL_TABLET | ORAL | Status: AC
  Administered 2017-04-03: 10 mg via ORAL
  Filled 2017-04-03: qty 2

## 2017-04-03 MED ORDER — CIPROFLOXACIN HCL 500 MG PO TABS
500.0000 mg | ORAL_TABLET | ORAL | Status: AC
  Administered 2017-04-03: 500 mg via ORAL
  Filled 2017-04-03: qty 1

## 2017-04-03 MED ORDER — HYDROCODONE-ACETAMINOPHEN 5-325 MG PO TABS: 1.0000 | ORAL_TABLET | Freq: Four times a day (QID) | ORAL | 0 refills | Status: AC | PRN

## 2017-04-03 NOTE — Op Note (Signed)
See [facility] OP note scanned into chart. 

## 2017-04-03 NOTE — Discharge Instructions (Addendum)
See [facility] discharge instructions in chart.    Moderate Conscious Sedation, Adult Sedation is the use of medicines to promote relaxation and relieve discomfort and anxiety. Moderate conscious sedation is a type of sedation. Under moderate conscious sedation, you are less alert than normal, but you are still able to respond to instructions, touch, or both. Moderate conscious sedation is used during short medical and dental procedures. It is milder than deep sedation, which is a type of sedation under which you cannot be easily woken up. It is also milder than general anesthesia, which is the use of medicines to make you unconscious. Moderate conscious sedation allows you to return to your regular activities sooner. Tell a health care provider about:  Any allergies you have.  All medicines you are taking, including vitamins, herbs, eye drops, creams, and over-the-counter medicines.  Use of steroids (by mouth or creams).  Any problems you or family members have had with sedatives and anesthetic medicines.  Any blood disorders you have.  Any surgeries you have had.  Any medical conditions you have, such as sleep apnea.  Whether you are pregnant or may be pregnant.  Any use of cigarettes, alcohol, marijuana, or street drugs. What are the risks? Generally, this is a safe procedure. However, problems may occur, including:  Getting too much medicine (oversedation).  Nausea.  Allergic reaction to medicines.  Trouble breathing. If this happens, a breathing tube may be used to help with breathing. It will be removed when you are awake and breathing on your own.  Heart trouble.  Lung trouble.  What happens before the procedure? Staying hydrated Follow instructions from your health care provider about hydration, which may include:  Up to 2 hours before the procedure - you may continue to drink clear liquids, such as water, clear fruit juice, black coffee, and plain  tea.  Eating and drinking restrictions Follow instructions from your health care provider about eating and drinking, which may include:  8 hours before the procedure - stop eating heavy meals or foods such as meat, fried foods, or fatty foods.  6 hours before the procedure - stop eating light meals or foods, such as toast or cereal.  6 hours before the procedure - stop drinking milk or drinks that contain milk.  2 hours before the procedure - stop drinking clear liquids.  Medicine  Ask your health care provider about:  Changing or stopping your regular medicines. This is especially important if you are taking diabetes medicines or blood thinners.  Taking medicines such as aspirin and ibuprofen. These medicines can thin your blood. Do not take these medicines before your procedure if your health care provider instructs you not to.  Tests and exams  You will have a physical exam.  You may have blood tests done to show: ? How well your kidneys and liver are working. ? How well your blood can clot. General instructions  Plan to have someone take you home from the hospital or clinic.  If you will be going home right after the procedure, plan to have someone with you for 24 hours. What happens during the procedure?  An IV tube will be inserted into one of your veins.  Medicine to help you relax (sedative) will be given through the IV tube.  The medical or dental procedure will be performed. What happens after the procedure?  Your blood pressure, heart rate, breathing rate, and blood oxygen level will be monitored often until the medicines you were  given have worn off.  Do not drive for 24 hours. This information is not intended to replace advice given to you by your health care provider. Make sure you discuss any questions you have with your health care provider.   Lithotripsy, Care After This sheet gives you information about how to care for yourself after your procedure.  Your health care provider may also give you more specific instructions. If you have problems or questions, contact your health care provider. What can I expect after the procedure? After the procedure, it is common to have: Some blood in your urine. This should only last for a few days. Soreness in your back, sides, or upper abdomen for a few days. Blotches or bruises on your back where the pressure wave entered the skin. Pain, discomfort, or nausea when pieces (fragments) of the kidney stone move through the tube that carries urine from the kidney to the bladder (ureter). Stone fragments may pass soon after the procedure, but they may continue to pass for up to 4-8 weeks. If you have severe pain or nausea, contact your health care provider. This may be caused by a large stone that was not broken up, and this may mean that you need more treatment. Some pain or discomfort during urination. Some pain or discomfort in the lower abdomen or (in men) at the base of the penis.  Follow these instructions at home: Medicines Take over-the-counter and prescription medicines only as told by your health care provider. If you were prescribed an antibiotic medicine, take it as told by your health care provider. Do not stop taking the antibiotic even if you start to feel better. Do not drive for 24 hours if you were given a medicine to help you relax (sedative). Do not drive or use heavy machinery while taking prescription pain medicine. Eating and drinking Drink enough water and fluids to keep your urine clear or pale yellow. This helps any remaining pieces of the stone to pass. It can also help prevent new stones from forming. Eat plenty of fresh fruits and vegetables. Follow instructions from your health care provider about eating and drinking restrictions. You may be instructed: To reduce how much salt (sodium) you eat or drink. Check ingredients and nutrition facts on packaged foods and beverages. To  reduce how much meat you eat. Eat the recommended amount of calcium for your age and gender. Ask your health care provider how much calcium you should have. General instructions Get plenty of rest. Most people can resume normal activities 1-2 days after the procedure. Ask your health care provider what activities are safe for you. If directed, strain all urine through the strainer that was provided by your health care provider. Keep all fragments for your health care provider to see. Any stones that are found may be sent to a medical lab for examination. The stone may be as small as a grain of salt. Keep all follow-up visits as told by your health care provider. This is important. Contact a health care provider if: You have pain that is severe or does not get better with medicine. You have nausea that is severe or does not go away. You have blood in your urine longer than your health care provider told you to expect. You have more blood in your urine. You have pain during urination that does not go away. You urinate more frequently than usual and this does not go away. You develop a rash or any other possible signs of  an allergic reaction. Get help right away if: You have severe pain in your back, sides, or upper abdomen. You have severe pain while urinating. Your urine is very dark red. You have blood in your stool (feces). You cannot pass any urine at all. You feel a strong urge to urinate after emptying your bladder. You have a fever or chills. You develop shortness of breath, difficulty breathing, or chest pain. You have severe nausea that leads to persistent vomiting. You faint. Summary After this procedure, it is common to have some pain, discomfort, or nausea when pieces (fragments) of the kidney stone move through the tube that carries urine from the kidney to the bladder (ureter). If this pain or nausea is severe, however, you should contact your health care provider. Most  people can resume normal activities 1-2 days after the procedure. Ask your health care provider what activities are safe for you. Drink enough water and fluids to keep your urine clear or pale yellow. This helps any remaining pieces of the stone to pass, and it can help prevent new stones from forming. If directed, strain your urine and keep all fragments for your health care provider to see. Fragments or stones may be as small as a grain of salt. Get help right away if you have severe pain in your back, sides, or upper abdomen or have severe pain while urinating. This information is not intended to replace advice given to you by your health care provider. Make sure you discuss any questions you have with your health care provider.

## 2017-04-03 NOTE — H&P (Signed)
Office Visit Report     03/23/2017   --------------------------------------------------------------------------------   Carlos Richards. Houseman  MRN: [id number]  PRIMARY CARE:  Carlos Merles, MD  DOB: [birth year], 56 year old Male  REFERRING:  Carlos Joe, MD  SSN: -[id number] [phone]  PROVIDER:  Jerilee Field, M.D.    TREATING:  Carlos Richards    LOCATION:  [facility] [phone]   --------------------------------------------------------------------------------   CC: I have ureteral stone.  HPI: Carlos Richards is a 56 year-old male established patient who is here for ureteral stone.  Complaining of some intermittent right flank pain/discomfort. Usually managed with Aleve. Symptoms present for 3 weeks. Nonradiating. No associated lower urinary tract symptoms. He is afebrile.    May 2018: Last seen in late March. We attempted to get him scheduled for right ESWL for a proximal stone but he canceled the procedure citing school commitments. He presents today for reevaluation. He's been having some intermittent suprapubic pain and discomfort with associated increase in urinary urgency and hesitancy. Denies gross hematuria. Denies any interval stone passage. He has had some interval right-sided flank and lower back pain that is mild to moderate in severity. He remains afebrile.    June 2018: Patient passed 2 small stone fragments and had several days of absence of symptoms. However over the past week he's had worsening lower urinary tract symptoms including increased urgency, bladder spasms, as well as some flank pain. Symptoms are tolerable and managed with anti-inflammatory. Denies nausea or vomiting. He is afebrile.   The problem is on the right side. He first stated noticing pain on approximately 12/22/2016. This is not his first kidney stone. He is currently having flank pain. He denies having back pain, groin pain, nausea, vomiting, fever, and chills. Pain is occuring on the right side.  He has not caught a stone in his urine strainer since his symptoms began.   He has never had surgical treatment for calculi in the past.     ALLERGIES: Cipro - "felt funny"    MEDICATIONS: Tamsulosin Hcl 0.4 mg capsule, ext release 24 hr 1 capsule PO Q HS  Losartan Potassium 100 mg tablet  Naprosyn PRN  Prozac 20 mg capsule     GU PSH: No GU PSH    NON-GU PSH: Neck Surgery (Unspecified)    GU PMH: Ureteral calculus, Right - 01/12/2017 Pelvic/perineal pain - 10/12/2016 Renal calculus - 10/12/2016    NON-GU PMH: Anxiety GERD Hypertension Sleep Apnea    FAMILY HISTORY: 12 daughters - Other Dementia - Mother Hypertension - Father, Mother Kidney Failure - Mother pneumonia - Father Prostate Cancer - Brother, Father Uterine Cancer - Mother   SOCIAL HISTORY: Marital Status: Married Current Smoking Status: Patient has never smoked.   Tobacco Use Assessment Completed: Used Tobacco in last 30 days? Drinks 1 caffeinated drink per day.    REVIEW OF SYSTEMS:    GU Review Male:   Patient reports frequent urination and get up at night to urinate. Patient denies hard to postpone urination, burning/ pain with urination, leakage of urine, stream starts and stops, trouble starting your stream, have to strain to urinate , erection problems, and penile pain.  Gastrointestinal (Upper):   Patient reports nausea. Patient denies vomiting and indigestion/ heartburn.  Gastrointestinal (Lower):   Patient denies diarrhea and constipation.  Constitutional:   Patient reports fatigue. Patient denies fever, night sweats, and weight loss.  Skin:   Patient denies skin rash/ lesion and itching.  Eyes:   Patient denies  blurred vision and double vision.  Ears/ Nose/ Throat:   Patient denies sore throat and sinus problems.  Hematologic/Lymphatic:   Patient denies swollen glands and easy bruising.  Cardiovascular:   Patient denies leg swelling and chest pains.  Respiratory:   Patient denies cough and  shortness of breath.  Endocrine:   Patient denies excessive thirst.  Musculoskeletal:   Patient reports back pain. Patient denies joint pain.  Neurological:   Patient denies headaches and dizziness.  Psychologic:   Patient denies depression and anxiety.   VITAL SIGNS:      03/23/2017 11:01 AM  Weight 210 lb / 95.25 kg  Height 68 in / 172.72 cm  BP 138/92 mmHg  Pulse 64 /min  Temperature 98.2 F / 37 C  BMI 31.9 kg/m   MULTI-SYSTEM PHYSICAL EXAMINATION:    Constitutional: Well-nourished. No physical deformities. Normally developed. Good grooming.  Respiratory: No labored breathing, no use of accessory muscles. CTA.  Cardiovascular: Normal temperature, normal extremity pulses, no swelling, no varicosities. RRR.  Lymphatic: No enlargement of neck, axillae, groin.  Skin: No paleness, no jaundice, no cyanosis. No lesion, no ulcer, no rash.  Neurologic / Psychiatric: Oriented to time, oriented to place, oriented to person. No depression, no anxiety, no agitation.  Gastrointestinal: Obese abdomen. No mass, no tenderness, no rigidity. No flank or suprapubic tenderness.  Musculoskeletal: Spine, ribs, pelvis no bilateral tenderness. Normal gait and station of head and neck.     PAST DATA REVIEWED:  Source Of History:  Patient  Records Review:   Previous Patient Records  Urine Test Review:   Urinalysis  X-Ray Review: KUB: Reviewed Films.     10/12/16  PSA  Total PSA 1.18     PROCEDURES:         KUB - 74018  A single view of the abdomen is obtained.      The previously noted ureteral calculus overlying the right sacrum has descended some and appears to have changed in position. Still remains in the distal right ureter. Stable nonobstructing left renal calculus. No other additional ureteral calculi identified. Bowel gas pattern within normal limits. No new bony abnormalities.         Urinalysis Dipstick Dipstick Cont'd  Color: Yellow Bilirubin: Neg  Appearance: Clear Ketones: Neg   Specific Gravity: 1.020 Blood: Neg  pH: 6.5 Protein: Neg  Glucose: Neg Urobilinogen: 0.2    Nitrites: Neg    Leukocyte Esterase: Neg    ASSESSMENT:      ICD-10 Details  1 GU:   Ureteral calculus - N20.1 Right   PLAN:           Orders Labs Urine Culture          Schedule Return Visit/Planned Activity: 1-2 Weeks - Schedule Surgery          Document Letter(s):  Created for Patient: Clinical Summary         Notes:   Larina BrasStone remains clearly visible on KUB, a little lower in the distal ureter. It may have fragmented some based on what he brings in today. I'll have him rescheduled for shockwave lithotripsy as patient is symptomatic today and eager to have stone appropriately treated.     * Signed by Carlos FuLarry Gibson on 03/23/17 at 11:32 AM (EDT)*     The information contained in this medical record document is considered private and confidential patient information. This information can only be used for the medical diagnosis and/or medical services that are being provided by the  patient's selected caregivers. This information can only be distributed outside of the patient's care if the patient agrees and signs waivers of authorization for this information to be sent to an outside source or route.

## 2017-04-03 NOTE — Interval H&P Note (Signed)
History and Physical Interval Note:  04/03/2017 11:46 AM  Carlos Richards  has presented today for surgery, with the diagnosis of RIGHT URETERAL STONE  The various methods of treatment have been discussed with the patient and family. After consideration of risks, benefits and other options for treatment, the patient has consented to  Procedure(s): RIGHT EXTRACORPOREAL SHOCK WAVE LITHOTRIPSY (ESWL) (Right) as a surgical intervention .  The patient's history has been reviewed, patient examined, no change in status, stable for surgery.  I have reviewed the patient's chart and labs.  Questions were answered to the patient's satisfaction.     Valetta Fulleravid S Caden Fatica

## 2017-04-04 ENCOUNTER — Encounter (HOSPITAL_COMMUNITY): Payer: Self-pay | Admitting: Urology

## 2017-04-06 DIAGNOSIS — H524 Presbyopia: Secondary | ICD-10-CM | POA: Diagnosis not present

## 2017-04-06 DIAGNOSIS — H2513 Age-related nuclear cataract, bilateral: Secondary | ICD-10-CM | POA: Diagnosis not present

## 2017-04-18 DIAGNOSIS — N200 Calculus of kidney: Secondary | ICD-10-CM | POA: Diagnosis not present

## 2017-05-16 DIAGNOSIS — N201 Calculus of ureter: Secondary | ICD-10-CM | POA: Diagnosis not present

## 2017-05-17 DIAGNOSIS — R8271 Bacteriuria: Secondary | ICD-10-CM | POA: Diagnosis not present

## 2017-05-21 ENCOUNTER — Emergency Department (HOSPITAL_BASED_OUTPATIENT_CLINIC_OR_DEPARTMENT_OTHER): Payer: BLUE CROSS/BLUE SHIELD

## 2017-05-21 ENCOUNTER — Encounter (HOSPITAL_BASED_OUTPATIENT_CLINIC_OR_DEPARTMENT_OTHER): Payer: Self-pay

## 2017-05-21 DIAGNOSIS — Y999 Unspecified external cause status: Secondary | ICD-10-CM | POA: Diagnosis not present

## 2017-05-21 DIAGNOSIS — I10 Essential (primary) hypertension: Secondary | ICD-10-CM | POA: Diagnosis not present

## 2017-05-21 DIAGNOSIS — S92321A Displaced fracture of second metatarsal bone, right foot, initial encounter for closed fracture: Secondary | ICD-10-CM | POA: Diagnosis not present

## 2017-05-21 DIAGNOSIS — Z79899 Other long term (current) drug therapy: Secondary | ICD-10-CM | POA: Insufficient documentation

## 2017-05-21 DIAGNOSIS — Y93E9 Activity, other interior property and clothing maintenance: Secondary | ICD-10-CM | POA: Diagnosis not present

## 2017-05-21 DIAGNOSIS — W228XXA Striking against or struck by other objects, initial encounter: Secondary | ICD-10-CM | POA: Insufficient documentation

## 2017-05-21 DIAGNOSIS — Y92009 Unspecified place in unspecified non-institutional (private) residence as the place of occurrence of the external cause: Secondary | ICD-10-CM | POA: Insufficient documentation

## 2017-05-21 DIAGNOSIS — S99921A Unspecified injury of right foot, initial encounter: Secondary | ICD-10-CM | POA: Diagnosis present

## 2017-05-21 NOTE — ED Triage Notes (Signed)
Patient reports a metal object approx 25lb fell of fireplace and landed on right foot.  Significant swelling noted.

## 2017-05-22 ENCOUNTER — Emergency Department (HOSPITAL_BASED_OUTPATIENT_CLINIC_OR_DEPARTMENT_OTHER)
Admission: EM | Admit: 2017-05-22 | Discharge: 2017-05-22 | Disposition: A | Discharge status: Home / Self Care | Payer: BLUE CROSS/BLUE SHIELD | Attending: Emergency Medicine | Admitting: Emergency Medicine

## 2017-05-22 DIAGNOSIS — S92321A Displaced fracture of second metatarsal bone, right foot, initial encounter for closed fracture: Secondary | ICD-10-CM

## 2017-05-22 NOTE — ED Provider Notes (Signed)
[facility] Provider Note   CSN: [id number] Arrival date & time: 05/21/17  2330     History   Chief Complaint Chief Complaint  Patient presents with  . Foot Injury    HPI DOYL Richards is a 56 y.o. male.  The history is provided by the patient.  Foot Injury    He was painting in his house, when a heavy metal object fell and landed on his right foot. His foot immediately swelled. He has not tried to bear weight on it. As long as no one is touching it, he rates pain at 0/10. He denies other injury.  Past Medical History:  Diagnosis Date  . Anxiety   . Complication of anesthesia    difficult intubation 2014  . GERD (gastroesophageal reflux disease)   . Hemorrhoid   . Hiatal hernia   . History of kidney stones   . Hypertension   . Hypertriglyceridemia     There are no active problems to display for this patient.   Past Surgical History:  Procedure Laterality Date  . EXTRACORPOREAL SHOCK WAVE LITHOTRIPSY Right 04/03/2017   Procedure: RIGHT EXTRACORPOREAL SHOCK WAVE LITHOTRIPSY (ESWL);  Surgeon: Barron Alvine, MD;  Location: [facility];  Service: Urology;  Laterality: Right;  . POSTEROLATERAL EXTRAFORAMINAL DISCECTOMY  2012   C5, C6       Home Medications    Prior to Admission medications   Medication Sig Start Date End Date Taking? Authorizing Provider  clonazePAM (KLONOPIN) 0.5 MG tablet  12/09/14   [provider]  docusate sodium (COLACE) 100 MG capsule Take 100 mg by mouth daily.    [provider]  fenofibrate 160 MG tablet  01/03/15   [provider]  FLUoxetine (PROZAC) 20 MG tablet Take 20 mg by mouth daily.    [provider]  HYDROcodone-acetaminophen (NORCO/VICODIN) 5-325 MG tablet Take 1-2 tablets by mouth every 6 (six) hours as needed. 04/03/17   Barron Alvine, MD  loratadine (CLARITIN) 10 MG tablet Take 10 mg by mouth daily.    [provider]  losartan (COZAAR) 100 MG tablet  01/02/15   [provider]  Multiple Vitamin (MULTIVITAMIN WITH MINERALS) TABS tablet Take 1 tablet by mouth daily.    [provider]  naproxen sodium (ANAPROX) 220 MG tablet Take 220 mg by mouth 2 (two) times daily as needed (for pain).    [provider]  tamsulosin (FLOMAX) 0.4 MG CAPS capsule Take 0.4 mg by mouth.    [provider]    Family History Family History  Problem Relation Age of Onset  . Kidney failure Mother   . COPD Mother   . Uterine cancer Mother   . Hypertension Mother   . Prostate cancer Father   . Dementia Father   . Hypertension Father   . Prostate cancer Brother   . Hypertension Brother     Social History Social History  Substance Use Topics  . Smoking status: Never Smoker  . Smokeless tobacco: Never Used  . Alcohol use No     Allergies   Apple; Pollen extract; and Sulfa antibiotics   Review of Systems Review of Systems  All other systems reviewed and are negative.    Physical Exam Updated Vital Signs BP (!) 150/96 (BP Location: Right Arm)   Pulse (!) 58   Resp 20   Ht 5\' 8"  (1.727 m)   Wt 94.3 kg (208 lb)   SpO2 99%   BMI 31.63 kg/m  Physical Exam  Nursing note and vitals reviewed.  56 year old male, resting comfortably and in no acute distress. Vital signs are Significant for hypertension and borderline bradycardia. Oxygen saturation is 99%, which is normal. Head is normocephalic and atraumatic. PERRLA, EOMI. Oropharynx is clear. Neck is nontender and supple without adenopathy or JVD. Back is nontender and there is no CVA tenderness. Lungs are clear without rales, wheezes, or rhonchi. Chest is nontender. Heart has regular rate and rhythm without murmur. Abdomen is soft, flat, nontender without masses or hepatosplenomegaly and peristalsis is normoactive. Extremities: Soft tissue swelling and ecchymosis is present over the distal right midfoot, with point tenderness over the distal second metatarsal. Distal  neurovascular exam is intact with normal sensation and prompt capillary refill. Skin is warm and dry without rash. Neurologic: Mental status is normal, cranial nerves are intact, there are no motor or sensory deficits.  ED Treatments / Results   Radiology Dg Foot Complete Right  Result Date: 05/22/2017 CLINICAL DATA:  25 lb metal object fell from fireplace and landed on right foot. Initial encounter. EXAM: RIGHT FOOT COMPLETE - 3+ VIEW COMPARISON:  None. FINDINGS: There is a mildly displaced fracture through the midshaft of the second metatarsal, with 1/2 shaft width plantar displacement of the distal metatarsal. The joint spaces are preserved. There is no evidence of talar subluxation; the subtalar joint is unremarkable in appearance. Small plantar and posterior calcaneal spurs are seen. Marked dorsal soft tissue swelling is noted at the forefoot. IMPRESSION: Mildly displaced fracture through the midshaft of the second metatarsal, with 1/2 shaft width plantar displacement of the distal metatarsal. Electronically Signed   By: Roanna RaiderJeffery  Chang M.D.   On: 05/22/2017 00:08    Procedures Procedures (including critical care time)  Medications Ordered in ED Medications - No data to display   Initial Impression / Assessment and Plan / ED Course  I have reviewed the triage vital signs and the nursing notes.  Pertinent imaging results that were available during my care of the patient were reviewed by me and considered in my medical decision making (see chart for details).  Closed fracture of the distal shaft of the right second metatarsal. This is mildly displaced, but does not require reduction. He is placed in a postop shoe and given crutches and referred to orthopedics for follow-up. Of note, he has hydrocodone-acetaminophen at home which she can use for pain not relieved by over-the-counter analgesics.  Final Clinical Impressions(s) / ED Diagnoses   Final diagnoses:  Closed displaced fracture of  second metatarsal bone of right foot, initial encounter    New Prescriptions New Prescriptions   No medications on file     [name], Hilton Saephan, MD 05/22/17 [phone]

## 2017-05-22 NOTE — Discharge Instructions (Signed)
Wear post-op shoe until directed to stop by orthopedic doctor. Use crutches until advised by orthopedic doctor that you can bear weight.  Apply ice, and elevate the foot as much as possible. Take naproxen as needed for pain. Take hydrocodone-acetaminophen as needed for severe pain.

## 2017-05-24 ENCOUNTER — Ambulatory Visit: Payer: BLUE CROSS/BLUE SHIELD | Admitting: Podiatry

## 2017-05-24 DIAGNOSIS — M79671 Pain in right foot: Secondary | ICD-10-CM | POA: Diagnosis not present

## 2017-06-05 DIAGNOSIS — N390 Urinary tract infection, site not specified: Secondary | ICD-10-CM | POA: Diagnosis not present

## 2017-06-05 DIAGNOSIS — N202 Calculus of kidney with calculus of ureter: Secondary | ICD-10-CM | POA: Diagnosis not present

## 2017-06-05 DIAGNOSIS — B951 Streptococcus, group B, as the cause of diseases classified elsewhere: Secondary | ICD-10-CM | POA: Diagnosis not present

## 2017-06-09 DIAGNOSIS — M79671 Pain in right foot: Secondary | ICD-10-CM | POA: Diagnosis not present

## 2017-06-29 DIAGNOSIS — M7989 Other specified soft tissue disorders: Secondary | ICD-10-CM | POA: Diagnosis not present

## 2017-06-30 DIAGNOSIS — S92901A Unspecified fracture of right foot, initial encounter for closed fracture: Secondary | ICD-10-CM | POA: Diagnosis not present

## 2017-07-04 DIAGNOSIS — G4733 Obstructive sleep apnea (adult) (pediatric): Secondary | ICD-10-CM | POA: Diagnosis not present

## 2017-07-21 DIAGNOSIS — S92901A Unspecified fracture of right foot, initial encounter for closed fracture: Secondary | ICD-10-CM | POA: Diagnosis not present

## 2017-08-04 DIAGNOSIS — Z23 Encounter for immunization: Secondary | ICD-10-CM | POA: Diagnosis not present

## 2017-08-16 DIAGNOSIS — Z125 Encounter for screening for malignant neoplasm of prostate: Secondary | ICD-10-CM | POA: Diagnosis not present

## 2017-08-16 DIAGNOSIS — N201 Calculus of ureter: Secondary | ICD-10-CM | POA: Diagnosis not present

## 2017-08-22 DIAGNOSIS — N201 Calculus of ureter: Secondary | ICD-10-CM | POA: Diagnosis not present

## 2017-09-06 DIAGNOSIS — D2371 Other benign neoplasm of skin of right lower limb, including hip: Secondary | ICD-10-CM | POA: Diagnosis not present

## 2017-09-06 DIAGNOSIS — L820 Inflamed seborrheic keratosis: Secondary | ICD-10-CM | POA: Diagnosis not present

## 2017-09-06 DIAGNOSIS — L821 Other seborrheic keratosis: Secondary | ICD-10-CM | POA: Diagnosis not present

## 2017-09-06 DIAGNOSIS — D1801 Hemangioma of skin and subcutaneous tissue: Secondary | ICD-10-CM | POA: Diagnosis not present

## 2017-09-06 DIAGNOSIS — L570 Actinic keratosis: Secondary | ICD-10-CM | POA: Diagnosis not present

## 2017-09-12 DIAGNOSIS — N201 Calculus of ureter: Secondary | ICD-10-CM | POA: Diagnosis not present

## 2017-09-13 DIAGNOSIS — N201 Calculus of ureter: Secondary | ICD-10-CM | POA: Diagnosis not present

## 2017-10-06 DIAGNOSIS — M542 Cervicalgia: Secondary | ICD-10-CM | POA: Diagnosis not present

## 2017-10-06 DIAGNOSIS — M5030 Other cervical disc degeneration, unspecified cervical region: Secondary | ICD-10-CM | POA: Diagnosis not present

## 2017-10-06 DIAGNOSIS — M4722 Other spondylosis with radiculopathy, cervical region: Secondary | ICD-10-CM | POA: Diagnosis not present

## 2017-12-15 DIAGNOSIS — H903 Sensorineural hearing loss, bilateral: Secondary | ICD-10-CM | POA: Diagnosis not present

## 2017-12-28 DIAGNOSIS — G4733 Obstructive sleep apnea (adult) (pediatric): Secondary | ICD-10-CM | POA: Diagnosis not present

## 2018-04-09 DIAGNOSIS — G4733 Obstructive sleep apnea (adult) (pediatric): Secondary | ICD-10-CM | POA: Diagnosis not present

## 2018-04-22 DIAGNOSIS — H10022 Other mucopurulent conjunctivitis, left eye: Secondary | ICD-10-CM | POA: Diagnosis not present

## 2018-07-26 DIAGNOSIS — G4733 Obstructive sleep apnea (adult) (pediatric): Secondary | ICD-10-CM | POA: Diagnosis not present

## 2018-08-01 DIAGNOSIS — D2262 Melanocytic nevi of left upper limb, including shoulder: Secondary | ICD-10-CM | POA: Diagnosis not present

## 2018-08-01 DIAGNOSIS — D1801 Hemangioma of skin and subcutaneous tissue: Secondary | ICD-10-CM | POA: Diagnosis not present

## 2018-08-01 DIAGNOSIS — D2261 Melanocytic nevi of right upper limb, including shoulder: Secondary | ICD-10-CM | POA: Diagnosis not present

## 2018-08-01 DIAGNOSIS — L821 Other seborrheic keratosis: Secondary | ICD-10-CM | POA: Diagnosis not present

## 2018-08-03 DIAGNOSIS — Z23 Encounter for immunization: Secondary | ICD-10-CM | POA: Diagnosis not present

## 2018-08-03 DIAGNOSIS — I10 Essential (primary) hypertension: Secondary | ICD-10-CM | POA: Diagnosis not present

## 2018-08-03 DIAGNOSIS — F419 Anxiety disorder, unspecified: Secondary | ICD-10-CM | POA: Diagnosis not present

## 2018-08-03 DIAGNOSIS — Z0000 Encounter for general adult medical examination without abnormal findings: Secondary | ICD-10-CM | POA: Diagnosis not present

## 2018-08-03 DIAGNOSIS — E782 Mixed hyperlipidemia: Secondary | ICD-10-CM | POA: Diagnosis not present

## 2018-08-03 DIAGNOSIS — Z125 Encounter for screening for malignant neoplasm of prostate: Secondary | ICD-10-CM | POA: Diagnosis not present

## 2019-06-11 IMAGING — CR DG ABDOMEN 1V
2 series · 2 of 2 positions shown · non-contrast
Comparison: 03/23/2017

CLINICAL DATA: Follow-up kidney stone

EXAM:
ABDOMEN - 1 VIEW

[t abdomen supine (1 of 2)]
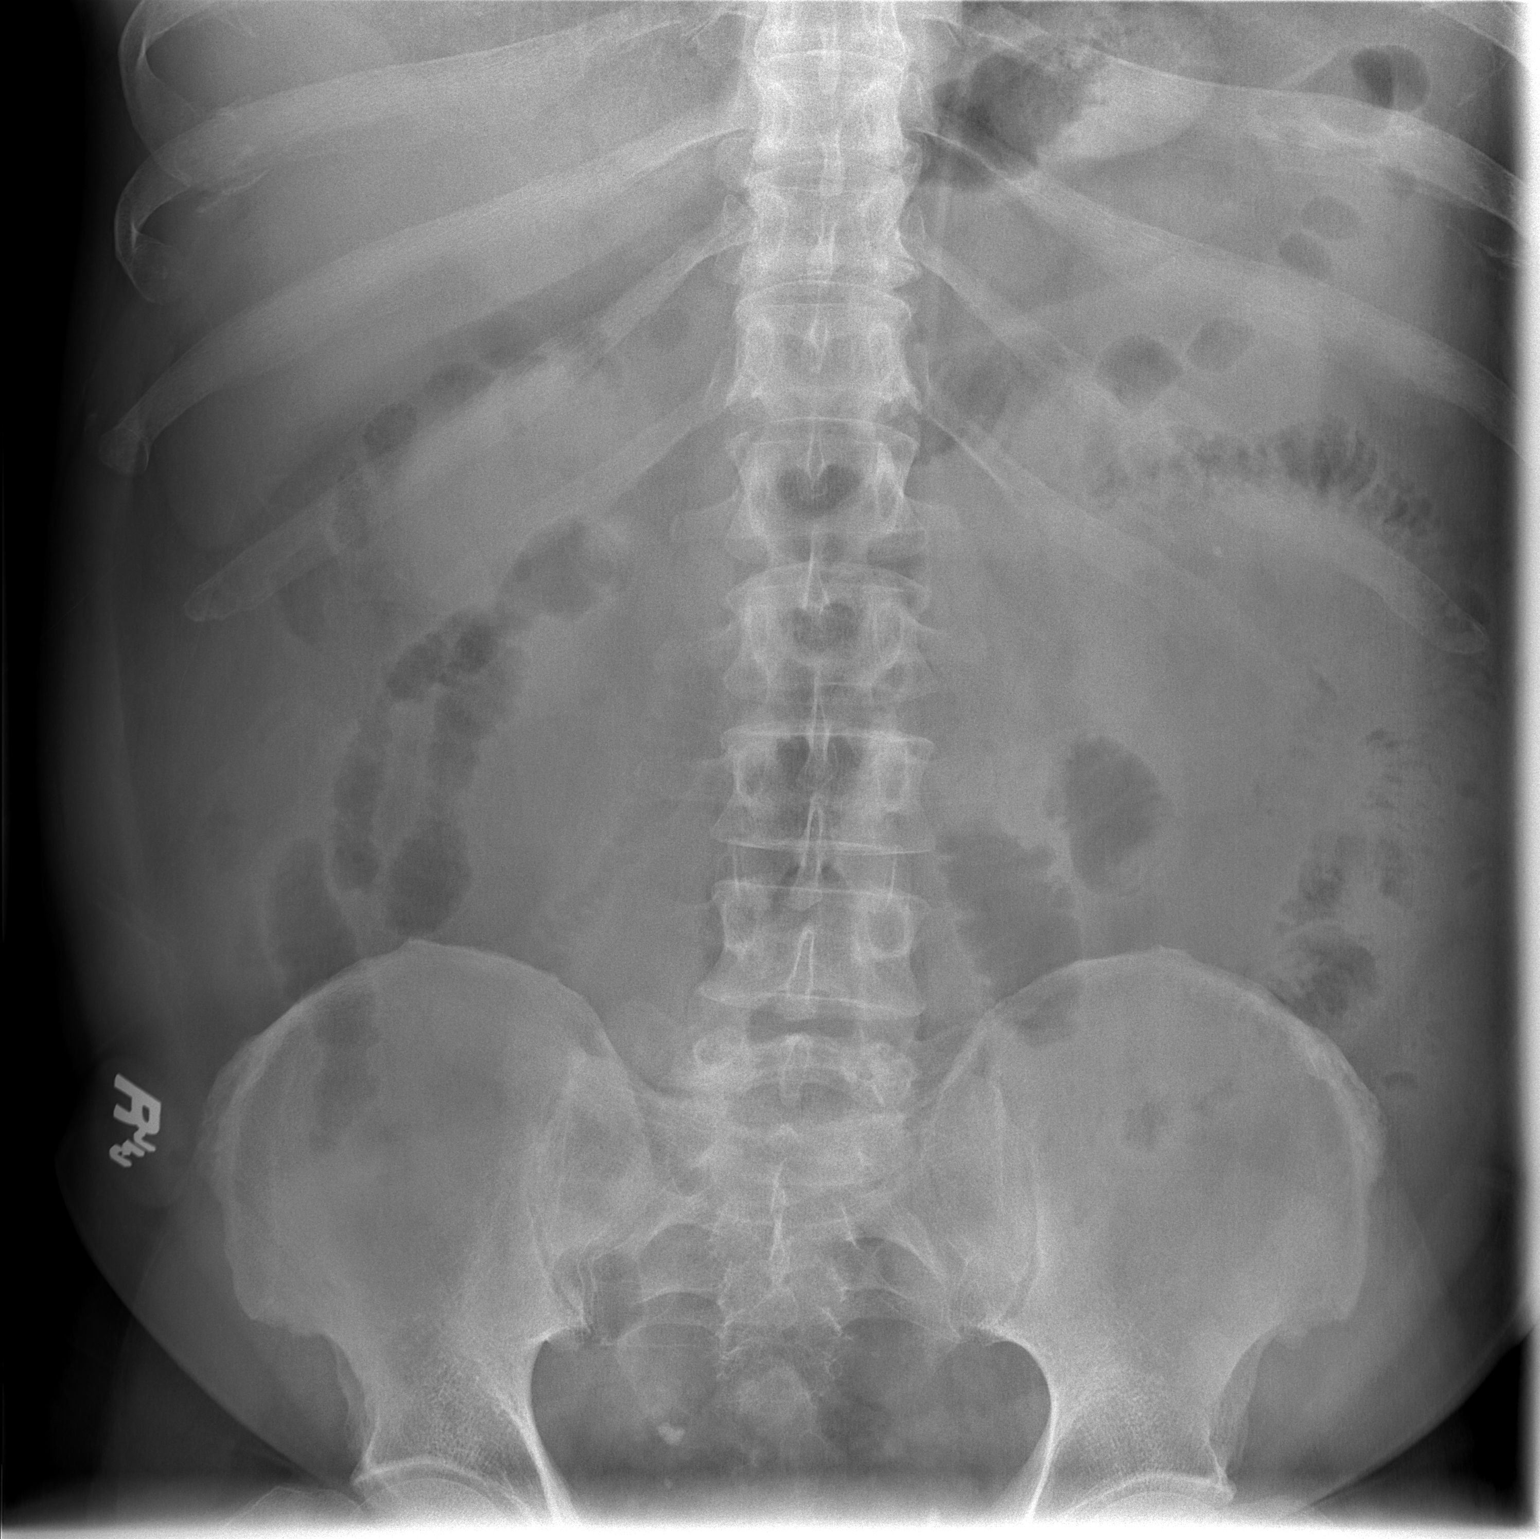

[t abdomen supine (2 of 2)]
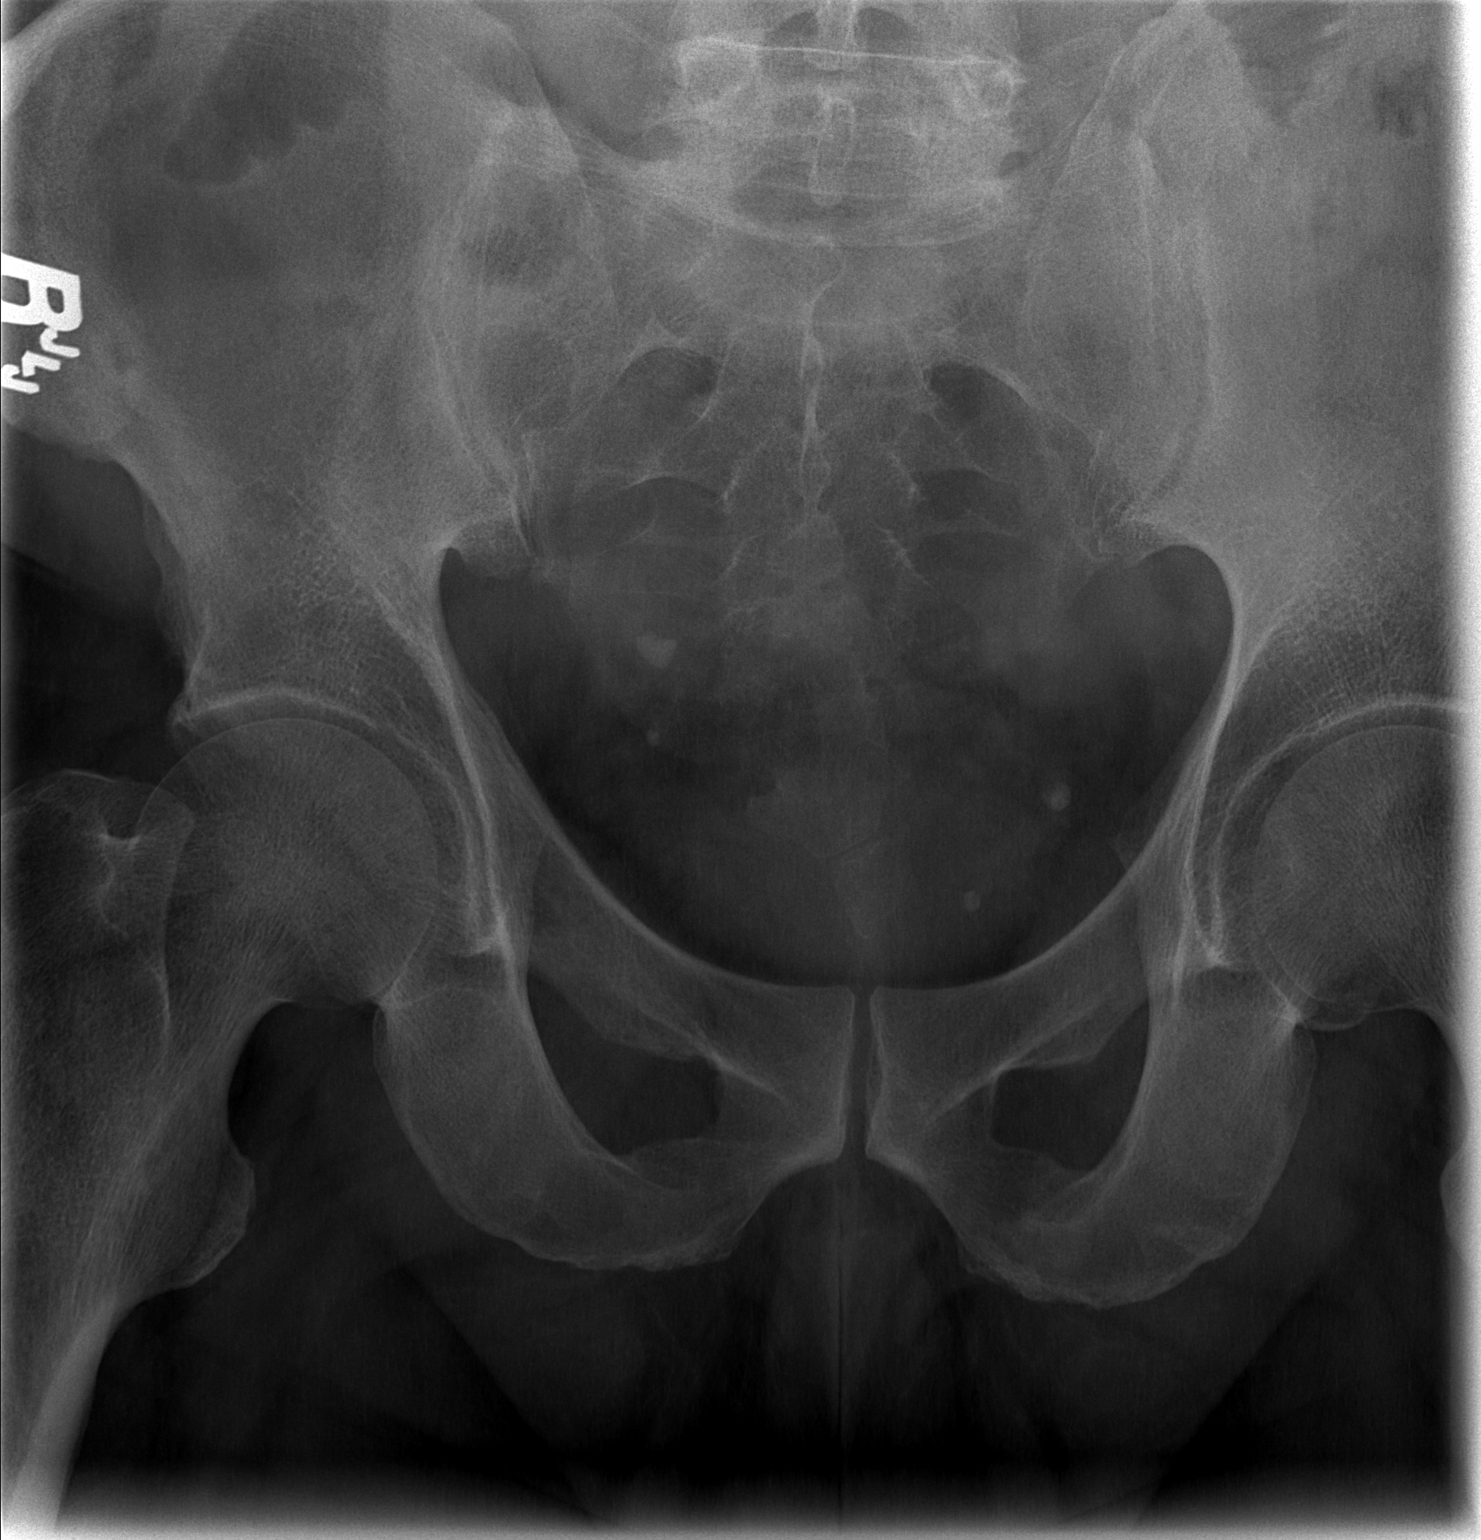

[2 of 2 positions shown; findings below may reference images not displayed]

FINDINGS: Scattered large and small bowel gas is noted. Small left renal
calculus is noted. Very calcification is noted in the right
hemipelvis stable from the prior exam. Scattered phleboliths are
noted as well.
IMPRESSION: Stable left renal and right ureteral stones.

## 2019-07-31 DIAGNOSIS — G4733 Obstructive sleep apnea (adult) (pediatric): Secondary | ICD-10-CM | POA: Diagnosis not present

## 2019-08-10 DIAGNOSIS — Z23 Encounter for immunization: Secondary | ICD-10-CM | POA: Diagnosis not present

## 2019-08-21 DIAGNOSIS — E782 Mixed hyperlipidemia: Secondary | ICD-10-CM | POA: Diagnosis not present

## 2019-08-21 DIAGNOSIS — F419 Anxiety disorder, unspecified: Secondary | ICD-10-CM | POA: Diagnosis not present

## 2019-08-21 DIAGNOSIS — Z0000 Encounter for general adult medical examination without abnormal findings: Secondary | ICD-10-CM | POA: Diagnosis not present

## 2019-08-21 DIAGNOSIS — Z125 Encounter for screening for malignant neoplasm of prostate: Secondary | ICD-10-CM | POA: Diagnosis not present

## 2019-08-21 DIAGNOSIS — I10 Essential (primary) hypertension: Secondary | ICD-10-CM | POA: Diagnosis not present

## 2019-08-21 DIAGNOSIS — Z6834 Body mass index (BMI) 34.0-34.9, adult: Secondary | ICD-10-CM | POA: Diagnosis not present

## 2019-08-27 ENCOUNTER — Other Ambulatory Visit: Payer: Self-pay

## 2019-08-27 DIAGNOSIS — L718 Other rosacea: Secondary | ICD-10-CM | POA: Diagnosis not present

## 2019-08-27 DIAGNOSIS — D1801 Hemangioma of skin and subcutaneous tissue: Secondary | ICD-10-CM | POA: Diagnosis not present

## 2019-08-27 DIAGNOSIS — L821 Other seborrheic keratosis: Secondary | ICD-10-CM | POA: Diagnosis not present

## 2019-08-27 DIAGNOSIS — Z20822 Contact with and (suspected) exposure to covid-19: Secondary | ICD-10-CM

## 2019-08-27 DIAGNOSIS — L573 Poikiloderma of Civatte: Secondary | ICD-10-CM | POA: Diagnosis not present

## 2019-08-29 LAB — NOVEL CORONAVIRUS, NAA: SARS-CoV-2, NAA: NOT DETECTED

## 2019-09-11 DIAGNOSIS — G4733 Obstructive sleep apnea (adult) (pediatric): Secondary | ICD-10-CM | POA: Diagnosis not present

## 2019-09-17 ENCOUNTER — Other Ambulatory Visit: Payer: Self-pay

## 2019-09-17 DIAGNOSIS — Z20822 Contact with and (suspected) exposure to covid-19: Secondary | ICD-10-CM

## 2019-09-18 DIAGNOSIS — G4733 Obstructive sleep apnea (adult) (pediatric): Secondary | ICD-10-CM | POA: Diagnosis not present

## 2019-09-18 LAB — NOVEL CORONAVIRUS, NAA: SARS-CoV-2, NAA: NOT DETECTED

## 2019-10-11 DIAGNOSIS — G4733 Obstructive sleep apnea (adult) (pediatric): Secondary | ICD-10-CM | POA: Diagnosis not present

## 2019-11-07 DIAGNOSIS — M7542 Impingement syndrome of left shoulder: Secondary | ICD-10-CM | POA: Diagnosis not present

## 2019-11-07 DIAGNOSIS — G8929 Other chronic pain: Secondary | ICD-10-CM | POA: Diagnosis not present

## 2019-11-07 DIAGNOSIS — M25512 Pain in left shoulder: Secondary | ICD-10-CM | POA: Diagnosis not present

## 2019-11-08 DIAGNOSIS — M7542 Impingement syndrome of left shoulder: Secondary | ICD-10-CM | POA: Diagnosis not present

## 2019-11-11 DIAGNOSIS — G4733 Obstructive sleep apnea (adult) (pediatric): Secondary | ICD-10-CM | POA: Diagnosis not present

## 2019-11-13 DIAGNOSIS — G4733 Obstructive sleep apnea (adult) (pediatric): Secondary | ICD-10-CM | POA: Diagnosis not present

## 2019-11-14 DIAGNOSIS — E782 Mixed hyperlipidemia: Secondary | ICD-10-CM | POA: Diagnosis not present

## 2019-11-22 DIAGNOSIS — M7041 Prepatellar bursitis, right knee: Secondary | ICD-10-CM | POA: Diagnosis not present

## 2019-11-26 ENCOUNTER — Ambulatory Visit: Payer: Self-pay | Attending: Internal Medicine

## 2019-11-26 DIAGNOSIS — Z20822 Contact with and (suspected) exposure to covid-19: Secondary | ICD-10-CM | POA: Insufficient documentation

## 2019-11-27 LAB — NOVEL CORONAVIRUS, NAA: SARS-CoV-2, NAA: NOT DETECTED

## 2019-11-28 ENCOUNTER — Telehealth: Payer: Self-pay | Admitting: General Practice

## 2019-11-28 NOTE — Telephone Encounter (Signed)
Negative COVID results given. Patient results "NOT Detected." Caller expressed understanding. ° °

## 2019-12-12 DIAGNOSIS — G4733 Obstructive sleep apnea (adult) (pediatric): Secondary | ICD-10-CM | POA: Diagnosis not present

## 2021-06-15 ENCOUNTER — Emergency Department (HOSPITAL_BASED_OUTPATIENT_CLINIC_OR_DEPARTMENT_OTHER)
Admission: EM | Admit: 2021-06-15 | Discharge: 2021-06-15 | Disposition: A | Discharge status: Home / Self Care | Payer: 59 | Attending: Emergency Medicine | Admitting: Emergency Medicine

## 2021-06-15 ENCOUNTER — Other Ambulatory Visit: Payer: Self-pay

## 2021-06-15 ENCOUNTER — Encounter (HOSPITAL_BASED_OUTPATIENT_CLINIC_OR_DEPARTMENT_OTHER): Payer: Self-pay | Admitting: *Deleted

## 2021-06-15 ENCOUNTER — Emergency Department (HOSPITAL_BASED_OUTPATIENT_CLINIC_OR_DEPARTMENT_OTHER): Payer: 59 | Admitting: Radiology

## 2021-06-15 DIAGNOSIS — I10 Essential (primary) hypertension: Secondary | ICD-10-CM | POA: Diagnosis not present

## 2021-06-15 DIAGNOSIS — Z79899 Other long term (current) drug therapy: Secondary | ICD-10-CM | POA: Insufficient documentation

## 2021-06-15 DIAGNOSIS — R030 Elevated blood-pressure reading, without diagnosis of hypertension: Secondary | ICD-10-CM | POA: Diagnosis present

## 2021-06-15 LAB — BASIC METABOLIC PANEL
Anion gap: 11 (ref 5–15)
BUN: 21 mg/dL — ABNORMAL HIGH (ref 6–20)
CO2: 24 mmol/L (ref 22–32)
Calcium: 9.4 mg/dL (ref 8.9–10.3)
Chloride: 102 mmol/L (ref 98–111)
Creatinine, Ser: 1.22 mg/dL (ref 0.61–1.24)
GFR, Estimated: >60 mL/min (ref >60)
Glucose, Bld: 130 mg/dL — ABNORMAL HIGH (ref 70–99)
Potassium: 4.1 mmol/L (ref 3.5–5.1)
Sodium: 137 mmol/L (ref 135–145)

## 2021-06-15 LAB — CBC
HCT: 43.9 % (ref 39.0–52.0)
Hemoglobin: 15.1 g/dL (ref 13.0–17.0)
MCH: 30.7 pg (ref 26.0–34.0)
MCHC: 34.4 g/dL (ref 30.0–36.0)
MCV: 89.2 fL (ref 80.0–100.0)
Platelets: 134 10*3/uL — ABNORMAL LOW (ref 150–400)
RBC: 4.92 MIL/uL (ref 4.22–5.81)
RDW: 12.2 % (ref 11.5–15.5)
WBC: 5.5 10*3/uL (ref 4.0–10.5)
nRBC: 0 % (ref 0.0–0.2)

## 2021-06-15 LAB — TROPONIN I (HIGH SENSITIVITY)
Troponin I (High Sensitivity): 6 ng/L (ref <18)
Troponin I (High Sensitivity): 8 ng/L (ref <18)

## 2021-06-15 NOTE — ED Triage Notes (Signed)
Pt says that he was not sleeping well tonight, so he got up and checked his BP and it was 202/80, takes losartan daily normally. Tonight took additional 2.5 amlodipine at 0200 and took a second one around 0210 for his elevated BP. When he first woke up he felt like he had indigestion and took med for the same, with relief.

## 2021-06-15 NOTE — ED Notes (Signed)
ED Provider at bedside. 

## 2021-06-15 NOTE — ED Provider Notes (Signed)
[facility] EMERGENCY DEPT Provider Note  CSN: [id number] Arrival date & time: 06/15/21 0222  Chief Complaint(s) Hypertension  HPI Carlos Richards is a 60 y.o. male who presents to the emergency department for elevated blood pressure.  He reports that he was feeling jittery inside and decided to check his blood pressure.  Noted that the readings showed systolics in the 200s.  Patient waited 10 minutes and checked it again and was unchanged.  He took extra doses of his home blood pressure medicine.  Decided to come in for evaluation given the elevation of the readings.  He denied any associated headache, dizziness, chest pain, shortness of breath.  Patient reports that he has a hiatal hernia and has epigastric quivering which she had during these episodes.  No associated nausea or vomiting.  No recent fevers or infections.  No cough or congestion.  No change in medication.   Hypertension   Past Medical History Past Medical History:  Diagnosis Date   Anxiety    Complication of anesthesia    difficult intubation 2014   GERD (gastroesophageal reflux disease)    Hemorrhoid    Hiatal hernia    History of kidney stones    Hypertension    Hypertriglyceridemia    There are no problems to display for this patient.  Home Medication(s) Prior to Admission medications   Medication Sig Start Date End Date Taking? Authorizing Provider  clonazePAM (KLONOPIN) 0.5 MG tablet  12/09/14   [provider]  docusate sodium (COLACE) 100 MG capsule Take 100 mg by mouth daily.    [provider]  fenofibrate 160 MG tablet  01/03/15   [provider]  FLUoxetine (PROZAC) 20 MG tablet Take 20 mg by mouth daily.    [provider]  HYDROcodone-acetaminophen (NORCO/VICODIN) 5-325 MG tablet Take 1-2 tablets by mouth every 6 (six) hours as needed. 04/03/17   Barron Alvine, MD  loratadine (CLARITIN) 10 MG tablet Take 10 mg by mouth daily.    [provider]  losartan (COZAAR) 100 MG tablet  01/02/15   [provider]  Multiple Vitamin (MULTIVITAMIN WITH MINERALS) TABS tablet Take 1 tablet by mouth daily.    [provider]  naproxen sodium (ANAPROX) 220 MG tablet Take 220 mg by mouth 2 (two) times daily as needed (for pain).    [provider]  tamsulosin (FLOMAX) 0.4 MG CAPS capsule Take 0.4 mg by mouth.    [provider]                                                                                                                                    Past Surgical History Past Surgical History:  Procedure Laterality Date   EXTRACORPOREAL SHOCK WAVE LITHOTRIPSY Right 04/03/2017   Procedure: RIGHT EXTRACORPOREAL SHOCK WAVE LITHOTRIPSY (ESWL);  Surgeon: Barron Alvine, MD;  Location: [facility];  Service: Urology;  Laterality: Right;   POSTEROLATERAL  EXTRAFORAMINAL DISCECTOMY  2012   C5, C6   Family History Family History  Problem Relation Age of Onset   Kidney failure Mother    COPD Mother    Uterine cancer Mother    Hypertension Mother    Prostate cancer Father    Dementia Father    Hypertension Father    Prostate cancer Brother    Hypertension Brother     Social History Social History   Tobacco Use   Smoking status: Never   Smokeless tobacco: Never  Substance Use Topics   Alcohol use: No    Alcohol/week: 0.0 standard drinks   Drug use: No   Allergies Apple, Pollen extract, and Sulfa antibiotics  Review of Systems Review of Systems All other systems are reviewed and are negative for acute change except as noted in the HPI  Physical Exam Vital Signs  I have reviewed the triage vital signs BP (!) 151/83   Pulse 67   Temp 98.3 F (36.8 C) (Oral)   Resp 14   Ht 5\' 8"  (1.727 m)   Wt 90.7 kg   SpO2 99%   BMI 30.41 kg/m   Physical Exam Vitals reviewed.  Constitutional:      General: He is not in acute distress.    Appearance: He is well-developed. He is not diaphoretic.  HENT:      Head: Normocephalic and atraumatic.     Nose: Nose normal.  Eyes:     General: No scleral icterus.       Right eye: No discharge.        Left eye: No discharge.     Conjunctiva/sclera: Conjunctivae normal.     Pupils: Pupils are equal, round, and reactive to light.  Cardiovascular:     Rate and Rhythm: Normal rate and regular rhythm.     Heart sounds: No murmur heard.   No friction rub. No gallop.  Pulmonary:     Effort: Pulmonary effort is normal. No respiratory distress.     Breath sounds: Normal breath sounds. No stridor. No rales.  Abdominal:     General: There is no distension.     Palpations: Abdomen is soft.     Tenderness: There is no abdominal tenderness.  Musculoskeletal:        General: No tenderness.     Cervical back: Normal range of motion and neck supple.  Skin:    General: Skin is warm and dry.     Findings: No erythema or rash.  Neurological:     Mental Status: He is alert and oriented to person, place, and time.    ED Results and Treatments Labs (all labs ordered are listed, but only abnormal results are displayed) Labs Reviewed  BASIC METABOLIC PANEL - Abnormal; Notable for the following components:      Result Value   Glucose, Bld 130 (*)    BUN 21 (*)    All other components within normal limits  CBC - Abnormal; Notable for the following components:   Platelets 134 (*)    All other components within normal limits  TROPONIN I (HIGH SENSITIVITY)  TROPONIN I (HIGH SENSITIVITY)  EKG  EKG Interpretation  Date/Time:  Tuesday June 15 2021 03:13:15 EDT Ventricular Rate:  67 PR Interval:  152 QRS Duration: 94 QT Interval:  388 QTC Calculation: 409 R Axis:   25 Text Interpretation: Sinus rhythm with Premature atrial complexes Incomplete right bundle branch block Borderline ECG Otherwise no significant change Confirmed by Drema Pry [phone]) on 06/15/2021 3:33:15 AM       Radiology DG Chest 2 View  Result Date: 06/15/2021 CLINICAL DATA:  Chest pain. EXAM: CHEST - 2 VIEW COMPARISON:  Chest radiograph dated 12/04/2015 FINDINGS: The lungs are clear. A 7 mm nodular density over the right lower lung field most likely nipple shadow. Repeat radiograph with placement of nipple markers may provide better evaluation if clinically indicated. There is no pleural effusion pneumothorax. The cardiac silhouette is within limits. No acute osseous pathology. IMPRESSION: 1. No active cardiopulmonary disease. 2. Probable nipple shadow over the right lower lung field. Electronically Signed   By: Elgie Collard M.D.   On: 06/15/2021 03:41    Pertinent labs & imaging results that were available during my care of the patient were reviewed by me and considered in my medical decision making (see MDM for details).  Medications Ordered in ED Medications - No data to display                                                                                                                                   Procedures Procedures  (including critical care time)  Medical Decision Making / ED Course I have reviewed the nursing notes for this encounter and the patient's prior records (if available in EHR or on provided paperwork).  Carlos Richards was evaluated in Emergency Department on 06/15/2021 for the symptoms described in the history of present illness. He was evaluated in the context of the global COVID-19 pandemic, which necessitated consideration that the patient might be at risk for infection with the SARS-CoV-2 virus that causes COVID-19. Institutional protocols and algorithms that pertain to the evaluation of patients at risk for COVID-19 are in a state of rapid change based on information released by regulatory bodies including the CDC and federal and state organizations. These policies and algorithms were followed during the patient's care  in the ED.     Elevated blood pressure readings. Seems to be trending down. Systolics in the 170s now.  Screening labs and EKG obtained.  Pertinent labs & imaging results that were available during my care of the patient were reviewed by me and considered in my medical decision making:  EKG without acute ischemic changes or evidence of pericarditis.  Serial troponins negative x2. CBC without leukocytosis or anemia. No significant electrolyte derangement or renal sufficiency.  Chest x-ray without evidence suggestive of pneumonia, pneumothorax, pneumomediastinum.  No abnormal contour of the mediastinum to suggest dissection. No evidence of acute injuries.  After several hours of monitoring, patient's blood pressure continued to  downtrend.  Now with systolics in the 140s.  Final Clinical Impression(s) / ED Diagnoses Final diagnoses:  None    The patient appears reasonably screened and/or stabilized for discharge and I doubt any other medical condition or other [facility] requiring further screening, evaluation, or treatment in the ED at this time prior to discharge. Safe for discharge with strict return precautions.  Disposition: Discharge  Condition: Good  I have discussed the results, Dx and Tx plan with the patient/family who expressed understanding and agree(s) with the plan. Discharge instructions discussed at length. The patient/family was given strict return precautions who verbalized understanding of the instructions. No further questions at time of discharge.    ED Discharge Orders     None       Follow Up: Johny BlamerHarris, William, MD [address] [place] [address] [phone]  Call  to schedule an appointment for close follow up    This chart was dictated using voice recognition software.  Despite best efforts to proofread,  errors can occur which can change the documentation meaning.    Nira Connardama, Estanislado Surgeon Eduardo, MD 06/15/21 [phone]

## 2022-07-28 ENCOUNTER — Encounter: Payer: Self-pay | Admitting: Anesthesiology

## 2022-07-29 ENCOUNTER — Ambulatory Visit: Admission: RE | Admit: 2022-07-29 | Payer: 59 | Source: Home / Self Care | Admitting: Internal Medicine

## 2022-07-29 ENCOUNTER — Encounter: Admission: RE | Payer: Self-pay | Source: Home / Self Care

## 2022-07-29 DIAGNOSIS — I4891 Unspecified atrial fibrillation: Secondary | ICD-10-CM

## 2022-07-29 SURGERY — CARDIOVERSION
Anesthesia: General

## 2022-08-03 ENCOUNTER — Other Ambulatory Visit: Payer: Self-pay | Admitting: Family Medicine

## 2022-08-03 DIAGNOSIS — Z136 Encounter for screening for cardiovascular disorders: Secondary | ICD-10-CM

## 2022-08-22 ENCOUNTER — Ambulatory Visit
Admission: RE | Admit: 2022-08-22 | Discharge: 2022-08-22 | Disposition: A | Discharge status: Home / Self Care | Payer: No Typology Code available for payment source | Source: Ambulatory Visit | Attending: Family Medicine | Admitting: Family Medicine

## 2022-08-22 DIAGNOSIS — Z136 Encounter for screening for cardiovascular disorders: Secondary | ICD-10-CM

## 2022-12-22 ENCOUNTER — Other Ambulatory Visit: Payer: Self-pay | Admitting: Family Medicine

## 2022-12-22 DIAGNOSIS — R109 Unspecified abdominal pain: Secondary | ICD-10-CM

## 2022-12-23 ENCOUNTER — Encounter: Payer: Self-pay | Admitting: Family Medicine

## 2022-12-26 ENCOUNTER — Ambulatory Visit
Admission: RE | Admit: 2022-12-26 | Discharge: 2022-12-26 | Disposition: A | Discharge status: Home / Self Care | Payer: 59 | Source: Ambulatory Visit | Attending: Family Medicine | Admitting: Family Medicine

## 2022-12-26 DIAGNOSIS — R109 Unspecified abdominal pain: Secondary | ICD-10-CM

## 2022-12-26 MED ORDER — IOPAMIDOL (ISOVUE-300) INJECTION 61%
100.0000 mL | Freq: Once | INTRAVENOUS | Status: AC | PRN
  Administered 2022-12-26: 100 mL via INTRAVENOUS

## 2024-08-01 DIAGNOSIS — I10 Essential (primary) hypertension: Secondary | ICD-10-CM | POA: Diagnosis not present

## 2024-08-01 DIAGNOSIS — Z23 Encounter for immunization: Secondary | ICD-10-CM | POA: Diagnosis not present

## 2024-08-01 DIAGNOSIS — E119 Type 2 diabetes mellitus without complications: Secondary | ICD-10-CM | POA: Diagnosis not present

## 2024-08-01 DIAGNOSIS — E782 Mixed hyperlipidemia: Secondary | ICD-10-CM | POA: Diagnosis not present

## 2024-08-01 DIAGNOSIS — G4733 Obstructive sleep apnea (adult) (pediatric): Secondary | ICD-10-CM | POA: Diagnosis not present
